# Patient Record
Sex: Male | Born: 1980 | Race: White | Hispanic: No | Marital: Single | Smoking: Current every day smoker
Health system: Southern US, Community
[De-identification: ages and names within clinical notes are randomized; demographics above are authoritative.]

## PROBLEM LIST (undated history)

## (undated) ENCOUNTER — Emergency Department (HOSPITAL_COMMUNITY): Admission: EM | Payer: Commercial Managed Care - HMO | Source: Home / Self Care

## (undated) DIAGNOSIS — E669 Obesity, unspecified: Secondary | ICD-10-CM

## (undated) DIAGNOSIS — F4310 Post-traumatic stress disorder, unspecified: Secondary | ICD-10-CM

## (undated) DIAGNOSIS — G709 Myoneural disorder, unspecified: Secondary | ICD-10-CM

## (undated) DIAGNOSIS — G4730 Sleep apnea, unspecified: Secondary | ICD-10-CM

## (undated) DIAGNOSIS — K219 Gastro-esophageal reflux disease without esophagitis: Secondary | ICD-10-CM

## (undated) DIAGNOSIS — E785 Hyperlipidemia, unspecified: Secondary | ICD-10-CM

## (undated) HISTORY — PX: FRACTURE SURGERY: SHX138

## (undated) HISTORY — DX: Hyperlipidemia, unspecified: E78.5

## (undated) HISTORY — DX: Obesity, unspecified: E66.9

## (undated) HISTORY — DX: Sleep apnea, unspecified: G47.30

---

## 2008-03-16 ENCOUNTER — Emergency Department (HOSPITAL_COMMUNITY): Admission: EM | Admit: 2008-03-16 | Discharge: 2008-03-17 | Payer: Self-pay | Admitting: Emergency Medicine

## 2010-01-14 HISTORY — PX: FRACTURE SURGERY: SHX138

## 2012-02-24 ENCOUNTER — Ambulatory Visit (INDEPENDENT_AMBULATORY_CARE_PROVIDER_SITE_OTHER): Payer: 59 | Admitting: Physician Assistant

## 2012-02-24 VITALS — BP 124/85 | HR 88 | Temp 98.5°F | Resp 18 | Ht 71.0 in | Wt 274.0 lb

## 2012-02-24 DIAGNOSIS — J988 Other specified respiratory disorders: Secondary | ICD-10-CM

## 2012-02-24 DIAGNOSIS — R0982 Postnasal drip: Secondary | ICD-10-CM

## 2012-02-24 DIAGNOSIS — B9789 Other viral agents as the cause of diseases classified elsewhere: Secondary | ICD-10-CM

## 2012-02-24 MED ORDER — TRIAMCINOLONE ACETONIDE(NASAL) 55 MCG/ACT NA INHA: 2.0000 | Freq: Every day | NASAL | Status: DC

## 2012-02-24 MED ORDER — GUAIFENESIN ER 1200 MG PO TB12: 1.0000 | ORAL_TABLET | Freq: Two times a day (BID) | ORAL | Status: DC

## 2012-02-24 NOTE — Progress Notes (Signed)
   [address], [place] [address]   Phone [phone]  Subjective:    Patient ID: Edwin Craig, male    DOB: 12/23/1980, 32 y.o.   MRN: [id number]  HPI Pt presents to clinic with 1 wk h/o cold symptoms.  Now with an irritated sore throat and stuff draining down the back of his throat with ear fullness and popping.  He has used a few doses of Nyquil to help him sleep at night because of the PND.  He has had no F,C.  No headaches.  He just does not feel like he is getting better. No exposures to strep.   Review of Systems  Constitutional: Negative for fever and chills.  HENT: Positive for congestion, sore throat and postnasal drip. Negative for rhinorrhea.   Respiratory: Negative for cough.   Gastrointestinal: Negative for nausea, vomiting and diarrhea.  Allergic/Immunologic: Negative for environmental allergies.  Neurological: Negative for headaches.  Psychiatric/Behavioral: Positive for sleep disturbance (2nd to PND).       Objective:   Physical Exam  Vitals reviewed. Constitutional: He is oriented to person, place, and time. He appears well-developed and well-nourished.  HENT:  Head: Normocephalic and atraumatic.  Right Ear: Hearing, tympanic membrane, external ear and ear canal normal.  Left Ear: Hearing, tympanic membrane, external ear and ear canal normal.  Nose: Mucosal edema (red) present.  Mouth/Throat: Uvula is midline, oropharynx is clear and moist and mucous membranes are normal.  Eyes: Conjunctivae are normal.  Neck: Normal range of motion. Neck supple.  Cardiovascular: Normal rate, regular rhythm and normal heart sounds.  Exam reveals no gallop.   No murmur heard. Pulmonary/Chest: Effort normal and breath sounds normal.  Lymphadenopathy:    He has no cervical adenopathy.  Neurological: He is alert and oriented to person, place, and time.  Skin: Skin is warm and dry.  Psychiatric: He has a normal mood and affect. His behavior is normal. Judgment and thought  content normal.          Assessment & Plan:   1. PND (post-nasal drip)   2. Viral respiratory illness    Push fluids.  Tylenol/motrin prn.  Meds as directed.  If no improvement in 1 wk call for abx to treat sinus infection.

## 2012-08-31 ENCOUNTER — Other Ambulatory Visit: Payer: Self-pay | Admitting: Occupational Medicine

## 2012-08-31 ENCOUNTER — Ambulatory Visit
Admission: RE | Admit: 2012-08-31 | Discharge: 2012-08-31 | Disposition: A | Discharge status: Home / Self Care | Payer: No Typology Code available for payment source | Source: Ambulatory Visit | Attending: Occupational Medicine | Admitting: Occupational Medicine

## 2012-08-31 DIAGNOSIS — Z0000 Encounter for general adult medical examination without abnormal findings: Secondary | ICD-10-CM

## 2016-02-05 ENCOUNTER — Emergency Department (HOSPITAL_COMMUNITY)
Admission: EM | Admit: 2016-02-05 | Discharge: 2016-02-05 | Disposition: A | Discharge status: Home / Self Care | Payer: Commercial Managed Care - HMO | Attending: Emergency Medicine | Admitting: Emergency Medicine

## 2016-02-05 ENCOUNTER — Encounter (HOSPITAL_COMMUNITY): Payer: Self-pay | Admitting: Emergency Medicine

## 2016-02-05 DIAGNOSIS — S01412A Laceration without foreign body of left cheek and temporomandibular area, initial encounter: Secondary | ICD-10-CM | POA: Insufficient documentation

## 2016-02-05 DIAGNOSIS — S01112A Laceration without foreign body of left eyelid and periocular area, initial encounter: Secondary | ICD-10-CM | POA: Insufficient documentation

## 2016-02-05 DIAGNOSIS — S61452A Open bite of left hand, initial encounter: Secondary | ICD-10-CM | POA: Insufficient documentation

## 2016-02-05 DIAGNOSIS — Z79899 Other long term (current) drug therapy: Secondary | ICD-10-CM | POA: Diagnosis not present

## 2016-02-05 DIAGNOSIS — S0181XA Laceration without foreign body of other part of head, initial encounter: Secondary | ICD-10-CM

## 2016-02-05 DIAGNOSIS — F17200 Nicotine dependence, unspecified, uncomplicated: Secondary | ICD-10-CM | POA: Insufficient documentation

## 2016-02-05 DIAGNOSIS — W540XXA Bitten by dog, initial encounter: Secondary | ICD-10-CM | POA: Insufficient documentation

## 2016-02-05 DIAGNOSIS — S0185XA Open bite of other part of head, initial encounter: Secondary | ICD-10-CM

## 2016-02-05 DIAGNOSIS — Y939 Activity, unspecified: Secondary | ICD-10-CM | POA: Insufficient documentation

## 2016-02-05 DIAGNOSIS — Y999 Unspecified external cause status: Secondary | ICD-10-CM | POA: Diagnosis not present

## 2016-02-05 DIAGNOSIS — Y929 Unspecified place or not applicable: Secondary | ICD-10-CM | POA: Insufficient documentation

## 2016-02-05 MED ORDER — AMOXICILLIN-POT CLAVULANATE 875-125 MG PO TABS
1.0000 | ORAL_TABLET | Freq: Once | ORAL | Status: AC
  Administered 2016-02-05: 1 via ORAL
  Filled 2016-02-05: qty 1

## 2016-02-05 MED ORDER — AMOXICILLIN-POT CLAVULANATE 875-125 MG PO TABS
1.0000 | ORAL_TABLET | Freq: Two times a day (BID) | ORAL | 0 refills | Status: DC
Start: 2016-02-05 — End: 2016-04-02

## 2016-02-05 NOTE — ED Triage Notes (Signed)
Pt here for dog bite to left hand and above left eye; scratches noted and bleeding controlled; pt sts dog is UTD on rabies

## 2016-02-05 NOTE — ED Provider Notes (Signed)
[facility]-EMERGENCY DEPT Provider Note   CSN: [id number] Arrival date & time: 02/05/16  4098   By signing my name below, I, Freida Busman, attest that this documentation has been prepared under the direction and in the presence of Rolland Porter, MD . Electronically Signed: Freida Busman, Scribe. 02/05/2016. 12:30 PM.  History   Chief Complaint Chief Complaint  Patient presents with  . Animal Bite    The history is provided by the patient. No language interpreter was used.    HPI Comments:  Edwin Craig is a 36 y.o. male who presents to the Emergency Department complaining of animal bites to the left hand, left eyelid, and left face which he sustained this AM. Pt was trying to bring his rotweiler back into the house when it became aggravated and bit the pt. The pt believes the dog's immunizations are UTD. His own Tetanus is UTD within the last 5 years. Pt has no other acute complaints or associated symptoms at this time.     History reviewed. No pertinent past medical history.  There are no active problems to display for this patient.   Past Surgical History:  Procedure Laterality Date  . FRACTURE SURGERY         Home Medications    Prior to Admission medications   Medication Sig Start Date End Date Taking? Authorizing Provider  amoxicillin-clavulanate (AUGMENTIN) 875-125 MG tablet Take 1 tablet by mouth 2 (two) times daily. 02/05/16   Rolland Porter, MD  Guaifenesin [facility] MAXIMUM STRENGTH) 1200 MG TB12 Take 1 tablet (1,200 mg total) by mouth 2 (two) times daily. 02/24/12   Morrell Riddle, PA-C  triamcinolone (NASACORT AQ) 55 MCG/ACT nasal inhaler Place 2 sprays into the nose daily. 02/24/12   Morrell Riddle, PA-C    Family History History reviewed. No pertinent family history.  Social History Social History  Substance Use Topics  . Smoking status: Light Tobacco Smoker    Packs/day: 0.12  . Smokeless tobacco: Not on file  . Alcohol use Yes     Allergies   Patient has  no known allergies.   Review of Systems Review of Systems  Constitutional: Negative for appetite change, chills, diaphoresis, fatigue and fever.  HENT: Negative for mouth sores, sore throat and trouble swallowing.   Eyes: Negative for visual disturbance.  Respiratory: Negative for cough, chest tightness, shortness of breath and wheezing.   Cardiovascular: Negative for chest pain.  Gastrointestinal: Negative for abdominal distention, abdominal pain, diarrhea, nausea and vomiting.  Endocrine: Negative for polydipsia, polyphagia and polyuria.  Genitourinary: Negative for dysuria, frequency and hematuria.  Musculoskeletal: Negative for gait problem.  Skin: Positive for wound. Negative for pallor and rash.  Neurological: Negative for dizziness, syncope, light-headedness and headaches.  Hematological: Does not bruise/bleed easily.  Psychiatric/Behavioral: Negative for behavioral problems and confusion.     Physical Exam Updated Vital Signs BP 139/87 (BP Location: Right Arm)   Pulse 89   Temp 99 F (37.2 C) (Oral)   Resp 18   Ht 6' (1.829 m)   Wt 270 lb (122.5 kg)   SpO2 96%   BMI 36.62 kg/m   Physical Exam  Constitutional: He is oriented to person, place, and time. He appears well-developed and well-nourished. No distress.  HENT:  Head: Normocephalic.  globe intact; sensation  to face intact  Eyes: Conjunctivae are normal. Pupils are equal, round, and reactive to light. No scleral icterus.  Neck: Normal range of motion. Neck supple. No thyromegaly present.  Cardiovascular: Normal rate  and regular rhythm.  Exam reveals no gallop and no friction rub.   No murmur heard. Pulmonary/Chest: Effort normal and breath sounds normal. No respiratory distress. He has no wheezes. He has no rales.  Abdominal: Soft. Bowel sounds are normal. He exhibits no distension. There is no tenderness. There is no rebound.  Musculoskeletal: Normal range of motion.  Neurological: He is alert and oriented  to person, place, and time.  Skin: Skin is warm and dry. No rash noted.  Multiple abrasions to the left hand, specifically the left thumb, and first digit  There is a pucture wound to the web space of the left hand 2 cm laceration to the superior aspect of left eyelid, right below the brow 1.5 cm to the left cheek just anterior to the left ear   Psychiatric: He has a normal mood and affect. His behavior is normal.     ED Treatments / Results  DIAGNOSTIC STUDIES:  Oxygen Saturation is 96% on RA, normal by my interpretation.    COORDINATION OF CARE:  12:21 PM Discussed treatment plan with pt at bedside and pt agreed to plan.  Labs (all labs ordered are listed, but only abnormal results are displayed) Labs Reviewed - No data to display  EKG  EKG Interpretation None       Radiology No results found.  Procedures Procedures (including critical care time)  LACERATION REPAIR PROCEDURE NOTE The patient's identification was confirmed and consent was obtained. This procedure was performed by Rolland Porter, MD at 12:22 PM. Site: left eyelid Sterile procedures observed Anesthetic used (type and amt): 1% lido with epi Suture type/size: 5-0 nylon Length: 2 cm # of Sutures: 6   Technique:simple  Antibx ointment applied Tetanus UTD Site anesthetized, irrigated with NS, explored without evidence of foreign body, wound well approximated, site covered with dry, sterile dressing.  Patient tolerated procedure well without complications. Instructions for care discussed verbally and patient provided with additional written instructions for homecare and f/u.  LACERATION REPAIR PROCEDURE NOTE The patient's identification was confirmed and consent was obtained. This procedure was performed by Rolland Porter, MD at 12:22 PM. Site: left cheek  Sterile procedures observed Anesthetic used (type and amt): 1% lido with epi Suture type/size: 5-0 nylon Length:1.5 cm  # of Sutures: 3 Technique:  simple Antibx ointment applied Tetanus UTD Site anesthetized, irrigated with NS, explored without evidence of foreign body, wound well approximated, site covered with dry, sterile dressing.  Patient tolerated procedure well without complications. Instructions for care discussed verbally and patient provided with additional written instructions for homecare and f/u.   Medications Ordered in ED Medications  amoxicillin-clavulanate (AUGMENTIN) 875-125 MG per tablet 1 tablet (1 tablet Oral Given 02/05/16 1304)     Initial Impression / Assessment and Plan / ED Course  I have reviewed the triage vital signs and the nursing notes.  Pertinent labs & imaging results that were available during my care of the patient were reviewed by me and considered in my medical decision making (see chart for details).   plan antibiotic prophylaxis. Suturable in 5-7 days.  Final Clinical Impressions(s) / ED Diagnoses   Final diagnoses:  Dog bite of face, initial encounter  Facial laceration, initial encounter    New Prescriptions Discharge Medication List as of 02/05/2016 12:57 PM    START taking these medications   Details  amoxicillin-clavulanate (AUGMENTIN) 875-125 MG tablet Take 1 tablet by mouth 2 (two) times daily., Starting Mon 02/05/2016, Print      I  personally performed the services described in this documentation, which was scribed in my presence. The recorded information has been reviewed and is accurate.     Rolland PorterMark Joyceann Kruser, MD 02/15/16 1435

## 2016-02-05 NOTE — Discharge Instructions (Signed)
Check in ER over next 2 days. (If through ER on duty just ask physician for wound check)  Suture removal 5-7 days with primary care, urgent care, or return to ER.

## 2016-04-02 ENCOUNTER — Ambulatory Visit (INDEPENDENT_AMBULATORY_CARE_PROVIDER_SITE_OTHER): Payer: Commercial Managed Care - HMO | Admitting: Neurology

## 2016-04-02 ENCOUNTER — Encounter: Payer: Self-pay | Admitting: Neurology

## 2016-04-02 DIAGNOSIS — R5383 Other fatigue: Secondary | ICD-10-CM | POA: Diagnosis not present

## 2016-04-02 DIAGNOSIS — R0683 Snoring: Secondary | ICD-10-CM

## 2016-04-02 DIAGNOSIS — G4733 Obstructive sleep apnea (adult) (pediatric): Secondary | ICD-10-CM | POA: Diagnosis not present

## 2016-04-02 DIAGNOSIS — Z6838 Body mass index (BMI) 38.0-38.9, adult: Secondary | ICD-10-CM | POA: Diagnosis not present

## 2016-04-02 NOTE — Progress Notes (Signed)
[facility]  PATIENT: Edwin Craig DOB: 1980/04/17  REFERRING DOCTOR OR PCP:  Dr. Gregary Signs SOURCE: patient, notes from Dr. Tresa Endo  _________________________________   HISTORICAL  CHIEF COMPLAINT:  Chief Complaint  Patient presents with  . Snoring    Edwin Craig is here today for eval of snoring, witnessed apneas by wife, daytime fatigue. Wakes during the night gasping. Remembers having a sleep study at age 36--in [place], but no study since./fim  . Daytime Fatigue    HISTORY OF PRESENT ILLNESS:  I had the pleasure seeing you patient, Edwin Craig, [facility] for neurologic consultation regarding his suspected sleep apnea.  He is a 36 year old man who has snoring and witnessed sleep apnea by his wife. His snoring is so loud, his wife will wear ear plugs and often sleeps in a different room  During the night, he will sometimes wake up gasping for air. He notes fatigue and lethargy during the day at times.   He has some sleepiness during the evenings.   Of note, even as a teenager, he snored father had noted that he sometimes has pauses in his breathing at night. Had gasping for air at night for at least the last 5 years. Weight has increased 40 pounds since last year, but he does not think there has been any change in his leap or snoring..  On a typical night, he goes to bed around 11 PM. He falls asleep within minutes. He usually sleeps well for a few hours but then will often wake up several times during the second half of the night. He will toss and turn something to get comfortable but usually is able to fall back asleep. He does not have nocturia.    He is usually up in the morning around 8 AM. He does not feel refreshed most mornings.  EPWORTH SLEEPINESS SCALE  On a scale of 0 - 3 what is the chance of dozing:  Sitting and Reading:   2 Watching TV:    3 Sitting inactive in a public place: 0 Passenger in car for one  hour: 0 Lying down to rest in the afternoon: 3 Sitting and talking to someone: 0 Sitting quietly after lunch:  0 In a car, stopped in traffic:  0  Total (out of 24):   8/24  He is healthy and does not have HTN or DM.    He has GERD.      REVIEW OF SYSTEMS: Constitutional: No fevers, chills, sweats, or change in appetite. Has fatigue Eyes: No visual changes, double vision, eye pain Ear, nose and throat: No hearing loss, ear pain, nasal congestion, sore throat Cardiovascular: No chest pain, palpitations Respiratory: No shortness of breath at rest or with exertion.   No wheezes.   Snoring GastrointestinaI: he has GERD.  No nausea, vomiting, diarrhea, abdominal pain, fecal incontinence Genitourinary: No dysuria, urinary retention or frequency.  No nocturia. Musculoskeletal: No neck pain, back pain Integumentary: No rash, pruritus, skin lesions Neurological: as above Psychiatric: No depression at this time.  No anxiety Endocrine: No palpitations, diaphoresis, change in appetite, change in weigh or increased thirst Hematologic/Lymphatic: No anemia, purpura, petechiae. Allergic/Immunologic: No itchy/runny eyes, nasal congestion, recent allergic reactions, rashes  ALLERGIES: No Known Allergies  HOME MEDICATIONS:  Current Outpatient Prescriptions:  .  pantoprazole (PROTONIX) 40 MG tablet, , Disp: , Rfl:   PAST MEDICAL HISTORY: No past medical history on file.  PAST SURGICAL HISTORY: Past Surgical History:  Procedure Laterality Date  .  FRACTURE SURGERY      FAMILY HISTORY: Family History  Problem Relation Age of Onset  . Healthy Mother   . Healthy Father   . Healthy Sister   . Healthy Brother   . Healthy Sister     SOCIAL HISTORY:  Social History   Social History  . Marital status: Single    Spouse name: N/A  . Number of children: N/A  . Years of education: N/A   Occupational History  . Not on file.   Social History Main Topics  . Smoking status: Current  Every Day Smoker    Packs/day: 0.12    Types: Cigarettes  . Smokeless tobacco: Current User  . Alcohol use Yes     Comment: 3-4 beers on the weekend  . Drug use: No  . Sexual activity: Yes   Other Topics Concern  . Not on file   Social History Narrative  . No narrative on file     PHYSICAL EXAM  Vitals:   04/02/16 1011  BP: 104/60  Pulse: 72  Resp: 14  Weight: 281 lb (127.5 kg)  Height: 6' (1.829 m)    Body mass index is 38.11 kg/m.   General: The patient is well-developed and well-nourished and in no acute distress.   Head is [place]/AT.   Pharynx is nonerythematous and Mallampati 3.     Eyes:  Funduscopic exam shows normal optic discs and retinal vessels.  Neck: The neck is supple, no carotid bruits are noted.  The neck is nontender.  Cardiovascular: The heart has a regular rate and rhythm with a normal S1 and S2. There were no murmurs, gallops or rubs. Lungs are clear to auscultation.  Skin: Extremities are without rash or edema.   Neurologic Exam  Mental status: The patient is alert and oriented x 3 at the time of the examination. The patient has apparent normal recent and remote memory, with an apparently normal attention span and concentration ability.   Speech is normal.  Cranial nerves: Extraocular movements are full. Pupils are equal, round, and reactive to light and accomodation.  Visual fields are full.  Facial symmetry is present. There is good facial sensation to soft touch bilaterally.Facial strength is normal.  Trapezius and sternocleidomastoid strength is normal. No dysarthria is noted.  The tongue is midline, and the patient has symmetric elevation of the soft palate. No obvious hearing deficits are noted.  Motor:  Muscle bulk is normal.   Tone is normal. Strength is  5 / 5 in all 4 extremities.   Sensory: Sensory testing is intact to pinprick, soft touch and vibration sensation in all 4 extremities.  Coordination: Cerebellar testing reveals good  finger-nose-finger and heel-to-shin bilaterally.  Gait and station: Station is normal.   Gait is normal. Tandem gait is normal. Romberg is negative.   Reflexes: Deep tendon reflexes are symmetric and normal bilaterally.   Plantar responses are flexor.    DIAGNOSTIC DATA (LABS, IMAGING, TESTING) - I reviewed patient records, labs, notes, testing and imaging myself where available.     ASSESSMENT AND PLAN  OSA (obstructive sleep apnea) - Plan: Split night study  Other fatigue  Snoring - Plan: Split night study  BMI 38.0-38.9,adult - Plan: Split night study   In summary, Mr. Thomasenia BottomsSletten is a 36 year old man who has snoring, daytime fatigue and witnessed say symptoms at night. Additionally, sometimes he wakes himself up gasping for air. He has obesity with a BMI is 38. He most likely has obstructive sleep  apnea and would likely benefit from treatment. We discussed that successful treatment of sleep apnea often leads to improved daytime wakefulness and can lower the risk of heart attack and stroke. A split-night study and we will initiate CPAP if he has moderate or severe sleep apnea. If mild OSA, he could be a candidate for other types of treatments as well. He is also advised to lose weight.  He will return to see me in 3 months or sooner if there are new or worsening neurologic symptoms.  She presently see Mr. Loescher for neurologic consultation. Please let me know if I can be of further assistance with her other patients in the future.   Josua Ferrebee A. Epimenio Foot, MD, PhD 04/02/2016, 10:25 AM Certified in Neurology, Clinical Neurophysiology, Sleep Medicine, Pain Medicine and Neuroimaging  [facility] [address] [place], [place] [address] [phone]

## 2016-05-14 ENCOUNTER — Ambulatory Visit (INDEPENDENT_AMBULATORY_CARE_PROVIDER_SITE_OTHER): Payer: Commercial Managed Care - HMO | Admitting: Neurology

## 2016-05-14 DIAGNOSIS — Z6838 Body mass index (BMI) 38.0-38.9, adult: Secondary | ICD-10-CM

## 2016-05-14 DIAGNOSIS — R0683 Snoring: Secondary | ICD-10-CM

## 2016-05-14 DIAGNOSIS — G4733 Obstructive sleep apnea (adult) (pediatric): Secondary | ICD-10-CM | POA: Diagnosis not present

## 2016-05-16 NOTE — Progress Notes (Signed)
PATIENT'S NAME:  Edwin Craig, Edwin Craig DOB:      06-23-1980      MR#:    [id number]     DATE OF RECORDING: 05/14/2016 REFERRING M.D.:  Gregary Signs, MD Study Performed:  Split-Night Titration Study HISTORY:  He is a 36 year old man who has snoring and witnessed sleep apnea by his wife. His snoring is so loud, his wife will wear ear plugs and often sleeps in a different room during the night, and he will sometimes wake up gasping for air. He notes fatigue and lethargy during the day at times.   He has some sleepiness during the evenings.   Of note, even as a teenager, he snored father had noted that he sometimes has pauses in his breathing at night.. Weight has increased 40 pounds since last year, but he does not think there has been any change in his leap or snoring.  OSA, fatigue and snoring  The patient endorsed the Epworth Sleepiness Scale at 8/24 points    The patient's weight 281 pounds with a height of 72 (inches), resulting in a BMI of 37.9 kg/m2.  CURRENT MEDICATIONS: Pantoprazole    PROCEDURE:  This is a multichannel digital polysomnogram utilizing the Somnostar 11.2 system.  Electrodes and sensors were applied and monitored per AASM Specifications.   EEG, EOG, Chin and Limb EMG, were sampled at 200 Hz.  ECG, Snore and Nasal Pressure, Thermal Airflow, Respiratory Effort, CPAP Flow and Pressure, Oximetry was sampled at 50 Hz. Digital video and audio were recorded.      BASELINE STUDY WITHOUT CPAP RESULTS:  Lights Out was at 22:06 and Lights On at 05:17.  Total recording time (TRT) was 211.5, with a total sleep time (TST) of 141.5 minutes.   The patient's sleep latency was 23 minutes.  REM latency was 0 minutes.  The sleep efficiency was 66.9 %.    SLEEP ARCHITECTURE: WASO (Wake after sleep onset) was 56.5 minutes, Stage N1 was 15.5 minutes, Stage N2 was 126 minutes, Stage N3 was 0 minutes and Stage R (REM sleep) was 0 minutes.  The percentages were Stage N1 11.%, Stage N2 89.%, Stage N3  0% and Stage R (REM sleep) 0%.   The sleep architecture was notable for lack of REM and N3 stage sleep  The arousals were noted as: 26 were spontaneous, 0 were associated with PLMs, 117 were associated with respiratory events.    Audio and video analysis did not show any abnormal or unusual movements, behaviors, phonations or vocalizations  EKG was in keeping with normal sinus rhythm (NSR)  RESPIRATORY ANALYSIS:  There were a total of 117 respiratory events:  61 obstructive apneas, 0 central apneas and 0 mixed apneas with a total of 61 apneas and an apnea index (AI) of 25.9. There were 56 hypopneas with a hypopnea index of 23.7. The patient also had 0 respiratory event related arousals (RERAs).  Snoring was noted.     The total APNEA/HYPOPNEA INDEX (AHI) was 49.6 /hour and the total RESPIRATORY DISTURBANCE INDEX was 49.6 /hour.  0 events occurred in REM sleep and 112 events in NREM. The REM AHI was 0, /hour versus a non-REM AHI of 49.6 /hour. The patient spent 5 minutes sleep time in the supine position 317 minutes in non-supine. The supine AHI was 60.0 /hour versus a non-supine AHI of 49.2 /hour.  OXYGEN SATURATION & C02:  The wake baseline 02 saturation was 95%, with the lowest being 85%. Time spent below 89% saturation equaled  84 minutes.  PERIODIC LIMB MOVEMENTS:    The patient had a total of 0 Periodic Limb Movements.  The Periodic Limb Movement (PLM) index was 0 /hour and the PLM Arousal index was 0 /hour.   TITRATION STUDY WITH CPAP RESULTS:   CPAP was initiated at 0/0/0/0 cmH20 with heated humidity per AASM split night standards and pressure was advanced to 9/9 cmH20 because of hypopneas, apneas and desaturations.  At a PAP pressure of 9 cmH20, there was a reduction of the AHI to 0 /hour.   Total recording time (TRT) was 220 minutes, with a total sleep time (TST) of 180 minutes. The patient's sleep latency was 35 minutes. REM latency was 70.5 minutes.  The sleep efficiency was 81.8 %.     SLEEP ARCHITECTURE: Wake after sleep was 15 minutes, Stage N1 9.5 minutes, Stage N2 108 minutes, Stage N3 0 minutes and Stage R (REM sleep) 62.5 minutes. The percentages were: Stage N1 5.3%, Stage N2 60.%, Stage N3 0% and Stage R (REM sleep) 34.7%. The sleep architecture was notable for reduced stage N3 sleep and increased stage REM sleep. .  The arousals were noted as: 13 were spontaneous, 0 were associated with PLMs, 2 were associated with respiratory events.  RESPIRATORY ANALYSIS:  There were a total of 2 respiratory events: 0 obstructive apneas, 0 central apneas and 0 mixed apneas with a total of 0 apneas and an apnea index (AI) of 0. There were 2 hypopneas with a hypopnea index of .7 /hour. The patient also had 0 respiratory event related arousals (RERAs).      The total APNEA/HYPOPNEA INDEX  (AHI) was .7 /hour and the total RESPIRATORY DISTURBANCE INDEX was .7 /hour.  0 events occurred in REM sleep and 2 events in NREM. The REM AHI was 0 /hour versus a non-REM AHI of 1. /hour. REM sleep was achieved on a pressure of  cm/h2o (AHI was  .) The patient spent 0% of total sleep time in the supine position. The supine AHI was 0.0 /hour, versus a non-supine AHI of 0.7/hour.  OXYGEN SATURATION & C02:  The wake baseline 02 saturation was 90%, with the lowest being 90%. Time spent below 89% saturation equaled 0 minutes.  PERIODIC LIMB MOVEMENTS:    The patient had a total of 0 Periodic Limb Movements. The Periodic Limb Movement (PLM) index was 0 /hour and the PLM Arousal index was 0 /hour.    POLYSOMNOGRAPHY IMPRESSION :   1. Severe Obstructive Sleep Apnea(OSA) with an AHI = 49.6 2. Successful CPAP titration with optimal pressure of 9 cm H2O    RECOMMENDATIONS:  1. Advise to start CPAP at 9 cmH2O and follow clinical symptomatology.   2. A F & P Simplus mask was used with heated humidity during this study.  Advise to add heated humidity.  Adjust interface and heated humidity as needed.      3. Compliance to PAP therapy should be emphasized.  Compliance, AHI and air leak information to be downloaded for objective assessment at 30 days, 180 days and annually thereafter.   4. Avoid sedative-hypnotics which may worsen sleep apnea, alcohol and tobacco (as applicable). 5. A follow up [facility].      I certify that I have reviewed the entire raw data recording prior to the issuance of this report in accordance with the Standards of Accreditation of the American Academy of Sleep Medicine (AASM)    Imojean Yoshino,MD,PhD  Diplomat, Biomedical engineer of Psychiatry and Neurology  Diplomat, Biomedical engineer of Sleep Medicine     Demographics and Medical History           Name: Erby, Sanderson Age: 1 BMI: 37.9 Interp Physician: Despina Arias, MD  DOB: [birth year] Ht-IN: 72 CM: 183 Referred By: Gregary Signs, MD  Pt. Tag:  Wt-LB: 281 KG: 127 Tested By: Idelle Leech, RPSGT  Pt. #: [id number] Sex: Male Scored By: Charlett Blake, RPSGT  Bed Tag: [id number] Race: Caucasian  Sleep Summary    Sleep Time Statistics Minutes Hours    Time in Bed 431.5    0.0    Total Sleep Time  321.5    0.0    Total Sleep Time NREM 259    0.0    Total Sleep Time REM 62.5    0.0    Sleep Onset 5.5 !C8 Is Not In Table    [facility] After Sleep Onset 104.5 !C9 Is Not In Table    Wake After Sleep Period 0    0.0    Latency Persistent Sleep 23    7.2    Sleep Efficiency 74.5 Percent    Lights out 22:06     Lights on 05:17    Sleep Disruption Events Count Index    Arousals 159 29.7    Awakenings 0 0    Arousals + Awakenings 159 29.7    REM Awakenings 0 0.0     Sleep Stage Statistics Wake N1 N2 N3 REM    Percent Stage to SPT 24.5  5.9  54.9  0.0  14.7  Percent   Sleep Period Time in Stage 104.5 25 234 0 62.5 Minutes   Latency to Stage  5.5 6.5 0 301.5 Minutes   Percent Stage to TST  7.8 72.8 0 19.4 Percent   EKG Summary          EKG  Statistics         Heart Rate, Wake 86.5 BPM  TST Epochs in HR Interval 0 < 29   Heart Rate, Steady Sleep Avg 78 BPM   0 30-59   PAC Events 0 Count   407 60-79   PVC Events 0 Count   235 80-99   Bradycardia 0 Count   0 100-119   Tachycardia 0 Count   1 120-139        0 140-159    NREM REM   0 > 160   Shortest R-R .5 .7       Longest R-R .9 .8           Respiration Summary    Event Statistics Total  With Arousal  With Awakening    Count Index  Count Index  Count Index   Apneas, Total 61 11.4  61 11.4   0 0.0    Hypopneas, Total 58 10.8  58 10.8   0 0.0    Apnea + Hypopnea Index 119 22.2   119 22.2   0 0.0   Oximetry Statistics        SpO2, Mean Wake 95 Percent  SpO2, Range -11 Percent   SpO2, Minimum 85 Percent  SpO2 Mean 91 Percent           SpO2 Intervals > 89% 80-89% 70-79% 60-69% 50-59% 40-49% 30-39% < 30%  321.5 Percent Sleep Time 62.1 37.9 0 0 0 0 0 0  Body Position Statistics   Back Side L Side R Side Prone    Total Sleep  Time   5 316.5 76 240.5 0 Minutes   Percent Time to TST   1.6  98.4  23.6  74.8  0.0  Percent   Number of Events   5 114.0 6 108 0 Count   Number of Apneas   0 61 0 61 0 Count   Number of Hypopneas   5 53 6 47 0 Count   Apnea Index   0.0  11.6  0.0  15.2  0.0  Index   Hypopnea Index   60.0  10.0  4.7  11.7  0.0  Index   Apnea + Hypopnea Index   60.0  21.6  4.7  26.9  0.0  Index    Respiration Events - Pre Treatment    Non REM, Pre Rx Statistics Non Supine  Supine    Central Mixed Obstr  Central Mixed Obstr   Apneas 0 0 61  0 0 0 Count  Apneas, Minimum SpO2 0 0 86  0 0 0 Percent     Hypopneas 0 0 51  0 0 5 Count  Hypopneas, Minimum SpO2 0 0 85  0 0 86 Percent     Apnea + Hypopneas Index 0.0 0.0 49.2  0.0 0.0 60.0 Index    REM, Pre Rx Statistics Non Supine  Supine    Central Mixed Obstr  Central Mixed Obstr   Apneas 0 0 0  0 0 0 Count  Apneas, Minimum SpO2 0 0 0  0 0 0 Percent     Hypopneas 0 0 0  0 0 0 Count    Hypopneas, Minimum SpO2 0 0 0  0 0 0 Percent     Apnea + Hypopnea Index 0.0 0.0 0.0  0.0 0.0 0.0 Index             Respiration Events - Post Treatment    Non REM, Post Rx Statistics Non Supine  Supine    Central Mixed Obstr  Central Mixed Obstr   Apneas 0 0 0  0 0 0 Count  Apneas, Minimum SpO2 0 0 0  0 0 0 Percent     Hypopneas 0 0 2  0 0 0 Count  Hypopneas, Minimum SpO2 0 0 93  0 0 0 Percent     Apnea + Hypoapnea Index 0.0 0.0 1.0  0.0 0.0 0.0 Index    REM, Post Rx Statistics Non Supine  Supine    Central Mixed Obstr  Central Mixed Obstr   Apneas 0 0 0  0 0 0 Count  Apneas, Minimum SpO2 0 0 0  0 0 0 Percent     Hypopneas 0 0 0  0 0 0 Count  Hypopneas, Minimum SpO2 0 0 0  0 0 0 Percent     Apnea + Hypopnea Index 0.0 0.0 0.0  0.0 0.0 0.0 Index   Leg Movement Summary    PLMS Non REM (Incl. Wake) REM Total    No Arousal Arousal Wake No Arousal Arousal Wake No Arousal Arousal Wake Total   Isolated 3 1 0 0 0 0 3 1 0 4    PLMS 0 0 0 0 0 0 0 0 0 0    Total 3 1 0 0 0 0 3 1 0 4   PLMS Statistics PLMS Total     Count Index Count Index    PLM 0 0 4 0.7     PLM with Arousal 0 0 1 0.2     PLM, with Wake 0 0  0 0.0     PLM, Arousal + Wake 0 0.0 1 0.2     PLM, No Arousal 0 0.0  3 0.6     PLM, Non REM 0 0.0  4 0.9     PLM, REM 0 0.0  0 0.0     Technician Comments:  Patient came in for a routine nocturnal Polysomnogram with possible CPAP titration. Patient was scored using the 4% rule. Patient was seen sleeping supine for a short period of time and both lateral positions. Patient did show signs of severe OSA despite sleeping position. Patient did say that he never sleeps supine. Patient was fitted to the Resmed AirFit P10 (Med) at first but shortly realized that he wouldn't be able to sleep with his mouth closed so a F&P Simplus was applied which he did well with and did seem to be titrated. Patient had zero restroom breaks during the study.      Sleep Summary                  Pre Tx Post Tx Total    Pre Tx Post Tx Total    Total Sleep Time 141.5 180 321.5 Minutes  Sleep Efficiency 66.9 81.8 74.5 Percent   TST Supine 5 0 5 Minutes  Sleep Onset 5.5 25 5.5 Minutes   TST Side/Prone 136.5 180.0 316.5 Minutes  REM Latency 0 70.5 301.5 Minutes  16.5 Time in Bed 211.5 220 431.5 Minutes                     0  Minutes    % TST   120  Pre Tx Post Tx Total    Pre Tx Post Tx Total   0 Stage N1 15.5 9.5 25 Minutes  Stage N1 11. 5.3 7.8 Percent   Stage N2 126 108 234 Minutes  Stage N2 89. 60. 72.8 Percent   Stage N3 0 0 0 Minutes  Stage N3 0 0 0 Percent   REM 0 62.5 62.5 Minutes  REM 0 34.7 19.4 Percent      Pre Tx Post Tx Total        Count Index Count  Index Count Index       Arousals 144 61.1 15 5. 159 29.7       Awakenings 0 0 0 0 0 0       ArArousals + Awakenings 144 61.1 15 5. 159 29.7              Lights out-22:06  Lights on- 05:17 Respiratory Summary                 Event Statistics Supine  Non-Supine  Total    Pre Tx Post Tx Total  Pre Tx Post Tx Total  Pre Tx Post Tx Total   Total A + H 5 0 5  112 2 114  117 2 119   AHI 60.0 0.0 60.0  49.2 0.7 21.6  49.6 .7 22.2   Obstructive 0 0 0  61 0 61  61 0 61   Central 0 0 0  0 0 0  0 0 0   Mixed 0 0 0  0 0 0  0 0 0   Hypopnea 5 0 5  51 2 53  56 2 58   REM A + H 0 0 0  0 0 0  0 0 0   REM AHI 0.0 0.0 0.0  0.0 0.0 0.0  0 0 0     SpO2 Statistics SpO2 - Pre Tx  SpO2 - Post Tx  SpO2 - Total    Min Max Mean  Min Max Mean  Min Max Mean   NREM 85 92 88  90 95 93  85 95 91   REM 0 0 0  92 95 93  92 95 93   Total 85 95 89  90 96 93  85 96 91      Pre Tx Post Tx Total           Percent of TST with SpO2 < 90% 86.2 0.0 37.9          PLMS Summary   PLMS Event Statistics Non REM  REM  Total    Pre Tx Post Tx Total  Pre Tx Post Tx Total  Pre Tx Post Tx Total   PLMS w/Arousal 0 0 0  0 0 0  0 0 0   PLMS w/Arousal Index 0.0  0.0  0.0   0.0  0.0  0.0   0 0 0   PLMS w/Awakening 0 0 0  0 0 0  0 0 0   PLMS  w/Awakening Index 0.0  0.0  0.0   0.0  0.0  0.0   0 0 0   PLMS w/Arousal + Awakening 0 0 0  0 0 0  0 0 0   PLMS w/Arousal + Awakening Index 0.0  0.0  0.0   0.0  0.0  0.0   0.0  0.0  0.0    Total PLMS 0 0 0  0 0 0  0 0 0   Total PLMS Index 0.0  0.0  0.0   0.0  0.0  0.0   0 0 0

## 2016-05-17 ENCOUNTER — Telehealth: Payer: Self-pay | Admitting: *Deleted

## 2016-05-17 DIAGNOSIS — G4733 Obstructive sleep apnea (adult) (pediatric): Secondary | ICD-10-CM

## 2016-05-17 NOTE — Telephone Encounter (Signed)
I have spoken with Edwin Craig this morning and per RAS, reviewed sleep study results. He verbalized understanding of same and is agreeable with starting CPAP.  Orders faxed to [facility]/fim

## 2016-07-03 ENCOUNTER — Encounter: Payer: Self-pay | Admitting: Neurology

## 2016-07-04 ENCOUNTER — Ambulatory Visit (INDEPENDENT_AMBULATORY_CARE_PROVIDER_SITE_OTHER): Payer: Commercial Managed Care - HMO | Admitting: Neurology

## 2016-07-04 ENCOUNTER — Encounter: Payer: Self-pay | Admitting: Neurology

## 2016-07-04 VITALS — BP 112/78 | HR 78 | Resp 16 | Ht 72.0 in | Wt 275.0 lb

## 2016-07-04 DIAGNOSIS — G4733 Obstructive sleep apnea (adult) (pediatric): Secondary | ICD-10-CM | POA: Diagnosis not present

## 2016-07-04 DIAGNOSIS — Z6838 Body mass index (BMI) 38.0-38.9, adult: Secondary | ICD-10-CM

## 2016-07-04 MED ORDER — FLUTICASONE PROPIONATE 50 MCG/ACT NA SUSP: 2.0000 | Freq: Every day | NASAL | 11 refills | Status: DC

## 2016-07-04 NOTE — Progress Notes (Signed)
[facility]  PATIENT: Edwin Craig DOB: 08-Oct-1980  REFERRING DOCTOR OR PCP:  Dr. Gregary Signs SOURCE: patient, notes from Dr. Tresa Endo  _________________________________   HISTORICAL  CHIEF COMPLAINT:   Chief Complaint  Patient presents with  . Sleep Apnea    CPAP compliance visit.  Sts. he has noticed he drools alot and has alot of congestion in his ears/fim    HISTORY OF PRESENT ILLNESS:  Edwin Craig is a 36 year old man with severe obstructive sleep apnea recently diagnosed. He started on CPAP +9 cm. A download performed today after about 30 days of use shows an AHI = 2.5 down from a pretreatment AHI = 49. He had excellent compliance with 97% use greater than 4 hours.    He uses a FF Mask.  He was unable to tolerate a nasal mask as he opens his mouth.    He feels less sleepy the next day.      Since starting CPAP, he notes congestion in his ears when he wakes up and it continues x many hours.     It does not clear with swallowing.    He would occasionally note congestion in his ears in the past, especially with scuba diving but it would be more transient.  He denies any fever.   He has had mild nasal congestion, helped by a nasal decongestant.     To start CPAP, he feels less sleepiness and fatigue.Marland Kitchen He also notes that he does not need to sleep as many hours to feel refreshed.   OSA History:    He was having snoring and witnessed sleep apnea by his wife. His snoring is so loud, his wife will wear ear plugs and often sleeps in a different room  During the night, he will sometimes wake up gasping for air. He notes fatigue and lethargy during the day at times.   He has some sleepiness during the evenings.   Of note, even as a teenager, he snored father had noted that he sometimes has pauses in his breathing at night. Had gasping for air at night for at least the last 5 years. Weight has increased 40 pounds since last year, but he does not think there has been  any change in his leap or snoring..   A PSG 05/14/2016 showed that the AHI was 49. He was titrated to CPAP +9 cm with a full facemask   EPWORTH SLEEPINESS SCALE  On a scale of 0 - 3 what is the chance of dozing:  Sitting and Reading:   0 Watching TV:    0 Sitting inactive in a public place: 0 Passenger in car for one hour: 0 Lying down to rest in the afternoon: 3 Sitting and talking to someone: 0 Sitting quietly after lunch:  0 In a car, stopped in traffic:  0  Total (out of 24):   3/24  (was 8/24)       REVIEW OF SYSTEMS: Constitutional: No fevers, chills, sweats, or change in appetite. Mild fatigue improved by CPAP Eyes: No visual changes, double vision, eye pain Ear, nose and throat: No hearing loss, ear pain, nasal congestion, sore throat Cardiovascular: No chest pain, palpitations Respiratory: No shortness of breath at rest or with exertion.   No wheezes.   He has OSA on CPAP GastrointestinaI: he has GERD.  No nausea, vomiting, diarrhea, abdominal pain, fecal incontinence Genitourinary: No dysuria, urinary retention or frequency.  No nocturia. Musculoskeletal: No neck pain, back pain Integumentary: No rash,  pruritus, skin lesions Neurological: as above Psychiatric: No depression at this time.  No anxiety Endocrine: No palpitations, diaphoresis, change in appetite, change in weigh or increased thirst Hematologic/Lymphatic: No anemia, purpura, petechiae. Allergic/Immunologic: No itchy/runny eyes, nasal congestion, recent allergic reactions, rashes  ALLERGIES: No Known Allergies  HOME MEDICATIONS:  Current Outpatient Prescriptions:  .  pantoprazole (PROTONIX) 40 MG tablet, , Disp: , Rfl:  .  fluticasone (FLONASE) 50 MCG/ACT nasal spray, Place 2 sprays into both nostrils daily., Disp: 16 g, Rfl: 11  PAST MEDICAL HISTORY: No past medical history on file.  PAST SURGICAL HISTORY: Past Surgical History:  Procedure Laterality Date  . FRACTURE SURGERY      FAMILY  HISTORY: Family History  Problem Relation Age of Onset  . Healthy Mother   . Healthy Father   . Healthy Sister   . Healthy Brother   . Healthy Sister     SOCIAL HISTORY:  Social History   Social History  . Marital status: Single    Spouse name: N/A  . Number of children: N/A  . Years of education: N/A   Occupational History  . Not on file.   Social History Main Topics  . Smoking status: Current Every Day Smoker    Packs/day: 0.12    Types: Cigarettes  . Smokeless tobacco: Current User  . Alcohol use Yes     Comment: 3-4 beers on the weekend  . Drug use: No  . Sexual activity: Yes   Other Topics Concern  . Not on file   Social History Narrative  . No narrative on file     PHYSICAL EXAM  Vitals:   07/04/16 1102  BP: 112/78  Pulse: 78  Resp: 16  Weight: 275 lb (124.7 kg)  Height: 6' (1.829 m)    Body mass index is 37.3 kg/m.   General: The patient is well-developed and well-nourished and in no acute distress.   Head is [place]/AT.   Pharynx is nonerythematous and Mallampati 3.       Neurologic Exam  Mental status: The patient is alert and oriented x 3 at the time of the examination. The patient has apparent normal recent and remote memory, with an apparently normal attention span and concentration ability.   Speech is normal.  Cranial nerves: Extraocular movements are full.   Facial strength is normal. No obvious hearing deficits are noted.  Motor:  Muscle bulk is normal.   Strength is normal.    Gait and station: Station is normal.   Gait is normal.        DIAGNOSTIC DATA (LABS, IMAGING, TESTING) - I reviewed patient records, labs, notes, testing and imaging myself where available.     ASSESSMENT AND PLAN  OSA (obstructive sleep apnea)  BMI 38.0-38.9,adult    1.   Since he is getting a stuffed up sensation in his ears, I will have him reduce the CPAP from +9 cm to +7 cm.   Additionally, nasal steroids can be tried (prescription for Flonase  provided) 2.    If not doing better or if worsening, consider a CT scan to determine if there is a mastoid effusion implying eustachian tube dysfunction. If this is occurring we can have him see ENT as well. 3.    Return in one year or call sooner if any problems. Gracilyn Gunia A. Epimenio FootSater, MD, PhD 07/04/2016, 11:31 AM Certified in Neurology, Clinical Neurophysiology, Sleep Medicine, Pain Medicine and Neuroimaging  [facility] [address], [address] [place],  [place] [address] [phone]

## 2017-07-04 ENCOUNTER — Ambulatory Visit: Payer: Commercial Managed Care - HMO | Admitting: Neurology

## 2018-02-19 ENCOUNTER — Ambulatory Visit: Payer: Commercial Managed Care - HMO | Admitting: Neurology

## 2018-02-19 ENCOUNTER — Encounter: Payer: Self-pay | Admitting: Neurology

## 2018-02-19 ENCOUNTER — Other Ambulatory Visit: Payer: Self-pay

## 2018-02-19 ENCOUNTER — Encounter

## 2018-02-19 VITALS — BP 132/79 | HR 78 | Resp 16 | Ht 72.0 in | Wt 302.0 lb

## 2018-02-19 DIAGNOSIS — R0683 Snoring: Secondary | ICD-10-CM | POA: Diagnosis not present

## 2018-02-19 DIAGNOSIS — G4733 Obstructive sleep apnea (adult) (pediatric): Secondary | ICD-10-CM | POA: Diagnosis not present

## 2018-02-19 NOTE — Progress Notes (Signed)
[facility]  PATIENT: Edwin Craig DOB: 02-01-1980  REFERRING DOCTOR OR PCP:  Dr. Gregary Signs SOURCE: patient, notes from Dr. Tresa Endo  _________________________________   HISTORICAL  CHIEF COMPLAINT:   Chief Complaint  Patient presents with  . Sleep Apnea    Sts. he is tolerating CPAP well since pressure was decreased from 9cm to 7cm H20 pressure./fim    HISTORY OF PRESENT ILLNESS:   Update 02/19/18: Edwin Craig is a 38 year old man with obstructive sleep apnea on CPAP.  He reports doing well.   He has been able to tolerate the CPAP better since the pressures were reduced after the last visit.    The download shows 100% compliance and excellent efficacy with AHI equals 1.4.  He denies excessive sleepiness.  He first had OSA signs around 2001 or 2002 when people noted he had severe snoring and pauses in breathing.  He would sometimes wake up with a snort/gasp.   Symptoms worsened more several years ago when he gained weight.  He had a sleep study split-night study 05/14/2016 showing an AHI = 49.    He was titrated to CPAP + and has tolerated it better going down from +9 to +7 cm H2O pressure.   I filled out the [place] form for sleep apnea.  He reports feeling less congested and the humidifier helps.    Once a week he wakes up and has trouble falling back asleep.     From 07/04/2016: Edwin Craig is a 38 year old man with severe obstructive sleep apnea recently diagnosed. He started on CPAP +9 cm. A download performed today after about 30 days of use shows an AHI = 2.5 down from a pretreatment AHI = 49. He had excellent compliance with 97% use greater than 4 hours.    He uses a FF Mask.  He was unable to tolerate a nasal mask as he opens his mouth.    He feels less sleepy the next day.      Since starting CPAP, he notes congestion in his ears whe he wakes up and it continues x many hours.     It does not clear with swallowing.    He would occasionally note congestion  in his ears in the past, especially with scuba diving but it would be more transient.  He denies any fever.   He has had mild nasal congestion, helped by a nasal decongestant.     To start CPAP, he feels less sleepiness and fatigue.Marland Kitchen He also notes that he does not need to sleep as many hours to feel refreshed.   OSA History:    He was having snoring and witnessed sleep apnea by his wife. His snoring is so loud, his wife will wear ear plugs and often sleeps in a different room  During the night, he will sometimes wake up gasping for air. He notes fatigue and lethargy during the day at times.   He has some sleepiness during the evenings.   Of note, even as a teenager, he snored father had noted that he sometimes has pauses in his breathing at night. Had gasping for air at night for at least the last 5 years. Weight has increased 40 pounds since last year, but he does not think there has been any change in his leap or snoring..   A PSG 05/14/2016 showed that the AHI was 49. He was titrated to CPAP +9 cm with a full facemask   EPWORTH SLEEPINESS SCALE  On a scale  of 0 - 3 what is the chance of dozing:  Sitting and Reading:   0 Watching TV:    0 Sitting inactive in a public place: 0 Passenger in car for one hour: 0 Lying down to rest in the afternoon: 3 Sitting and talking to someone: 0 Sitting quietly after lunch:  0 In a car, stopped in traffic:  0  Total (out of 24):   3/24  (was 8/24)       REVIEW OF SYSTEMS: Constitutional: No fevers, chills, sweats, or change in appetite. Mild fatigue improved by CPAP Eyes: No visual changes, double vision, eye pain Ear, nose and throat: No hearing loss, ear pain, nasal congestion, sore throat Cardiovascular: No chest pain, palpitations Respiratory: No shortness of breath at rest or with exertion.   No wheezes.   He has OSA on CPAP GastrointestinaI: he has GERD.  No nausea, vomiting, diarrhea, abdominal pain, fecal incontinence Genitourinary: No  dysuria, urinary retention or frequency.  No nocturia. Musculoskeletal: No neck pain, back pain Integumentary: No rash, pruritus, skin lesions Neurological: as above Psychiatric: No depression at this time.  No anxiety Endocrine: No palpitations, diaphoresis, change in appetite, change in weigh or increased thirst Hematologic/Lymphatic: No anemia, purpura, petechiae. Allergic/Immunologic: No itchy/runny eyes, nasal congestion, recent allergic reactions, rashes  ALLERGIES: No Known Allergies  HOME MEDICATIONS:  Current Outpatient Medications:  .  OVER THE COUNTER MEDICATION, [employer] brand nasal spray, Disp: , Rfl:  .  pantoprazole (PROTONIX) 40 MG tablet, , Disp: , Rfl:   PAST MEDICAL HISTORY: No past medical history on file.  PAST SURGICAL HISTORY: Past Surgical History:  Procedure Laterality Date  . FRACTURE SURGERY      FAMILY HISTORY: Family History  Problem Relation Age of Onset  . Healthy Mother   . Healthy Father   . Healthy Sister   . Healthy Brother   . Healthy Sister     SOCIAL HISTORY:  Social History   Socioeconomic History  . Marital status: Single    Spouse name: Not on file  . Number of children: Not on file  . Years of education: Not on file  . Highest education level: Not on file  Occupational History  . Not on file  Social Needs  . Financial resource strain: Not on file  . Food insecurity:    Worry: Not on file    Inability: Not on file  . Transportation needs:    Medical: Not on file    Non-medical: Not on file  Tobacco Use  . Smoking status: Current Every Day Smoker    Packs/day: 0.12    Types: Cigarettes  . Smokeless tobacco: Current User  Substance and Sexual Activity  . Alcohol use: Yes    Comment: 3-4 beers on the weekend  . Drug use: No  . Sexual activity: Yes  Lifestyle  . Physical activity:    Days per week: Not on file    Minutes per session: Not on file  . Stress: Not on file  Relationships  . Social connections:     Talks on phone: Not on file    Gets together: Not on file    Attends religious service: Not on file    Active member of club or organization: Not on file    Attends meetings of clubs or organizations: Not on file    Relationship status: Not on file  . Intimate partner violence:    Fear of current or ex partner: Not on file  Emotionally abused: Not on file    Physically abused: Not on file    Forced sexual activity: Not on file  Other Topics Concern  . Not on file  Social History Narrative  . Not on file     PHYSICAL EXAM  Vitals:   02/19/18 1143  BP: 132/79  Pulse: 78  Resp: 16  Weight: (!) 302 lb (137 kg)  Height: 6' (1.829 m)    Body mass index is 40.96 kg/m.   General: The patient is well-developed and well-nourished and in no acute distress.   Head is /AT.   Pharynx is nonerythematous and Mallampati 3.       Neurologic Exam  Mental status: The patient is alert and oriented x 3 at the time of the examination. The patient has apparent normal recent and remote memory, with an apparently normal attention span and concentration ability.   Speech is normal.  Cranial nerves: Extraocular movements are full.   Facial strength is normal. No obvious hearing deficits are noted.  Motor: Muscle bulk is normal.  Strength is normal.   Gait and station: Station is normal.   Gait is normal.  Tandem gait is normal.      DIAGNOSTIC DATA (LABS, IMAGING, TESTING) - I reviewed patient records, labs, notes, testing and imaging myself where available.     ASSESSMENT AND PLAN  No diagnosis found.    1.  Continue CPAP +7 cm H2O. 2.    I [facility] the [facility]. 3.    Return in one year or call sooner if any problems. Skyy Nilan A. Epimenio FootSater, MD, PhD 02/19/2018, 1:44 PM Certified in Neurology, Clinical Neurophysiology, Sleep Medicine, Pain Medicine and Neuroimaging  [facility] [address], [address] [place], [place] [address] [phone]

## 2018-05-06 ENCOUNTER — Telehealth: Payer: Self-pay | Admitting: Neurology

## 2018-05-06 NOTE — Telephone Encounter (Signed)
Faxed printed/signed orders to [phone]. Received fax confirmation.

## 2018-05-06 NOTE — Telephone Encounter (Signed)
We just received the fax this morning. Pending MD signature and then will fax.

## 2018-05-06 NOTE — Telephone Encounter (Signed)
Pt left a message stating he needs a new mask and [facility] said that the pt would need a new RX. Pt said, that [facility] told him they faxed over a request and has never received anything back. Can you check on this when you get a chance.

## 2018-07-17 ENCOUNTER — Encounter (HOSPITAL_COMMUNITY): Payer: Self-pay | Admitting: *Deleted

## 2018-07-17 ENCOUNTER — Emergency Department (HOSPITAL_COMMUNITY)
Admission: EM | Admit: 2018-07-17 | Discharge: 2018-07-17 | Disposition: A | Discharge status: Home / Self Care | Payer: No Typology Code available for payment source | Attending: Emergency Medicine | Admitting: Emergency Medicine

## 2018-07-17 ENCOUNTER — Emergency Department (HOSPITAL_COMMUNITY): Payer: No Typology Code available for payment source

## 2018-07-17 ENCOUNTER — Other Ambulatory Visit: Payer: Self-pay

## 2018-07-17 DIAGNOSIS — F17210 Nicotine dependence, cigarettes, uncomplicated: Secondary | ICD-10-CM | POA: Diagnosis not present

## 2018-07-17 DIAGNOSIS — Y9389 Activity, other specified: Secondary | ICD-10-CM | POA: Diagnosis not present

## 2018-07-17 DIAGNOSIS — Y990 Civilian activity done for income or pay: Secondary | ICD-10-CM | POA: Insufficient documentation

## 2018-07-17 DIAGNOSIS — X500XXA Overexertion from strenuous movement or load, initial encounter: Secondary | ICD-10-CM | POA: Diagnosis not present

## 2018-07-17 DIAGNOSIS — S82141A Displaced bicondylar fracture of right tibia, initial encounter for closed fracture: Secondary | ICD-10-CM | POA: Insufficient documentation

## 2018-07-17 DIAGNOSIS — S82401A Unspecified fracture of shaft of right fibula, initial encounter for closed fracture: Secondary | ICD-10-CM | POA: Insufficient documentation

## 2018-07-17 DIAGNOSIS — S8991XA Unspecified injury of right lower leg, initial encounter: Secondary | ICD-10-CM | POA: Diagnosis present

## 2018-07-17 DIAGNOSIS — Y929 Unspecified place or not applicable: Secondary | ICD-10-CM | POA: Diagnosis not present

## 2018-07-17 DIAGNOSIS — M25061 Hemarthrosis, right knee: Secondary | ICD-10-CM | POA: Insufficient documentation

## 2018-07-17 DIAGNOSIS — S82201A Unspecified fracture of shaft of right tibia, initial encounter for closed fracture: Secondary | ICD-10-CM

## 2018-07-17 MED ORDER — OXYCODONE-ACETAMINOPHEN 5-325 MG PO TABS
2.0000 | ORAL_TABLET | Freq: Once | ORAL | Status: AC
  Administered 2018-07-17: 2 via ORAL
  Filled 2018-07-17: qty 2

## 2018-07-17 MED ORDER — OXYCODONE-ACETAMINOPHEN 7.5-325 MG PO TABS: 1.0000 | ORAL_TABLET | ORAL | 0 refills | Status: DC | PRN
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
## 2018-07-17 NOTE — ED Notes (Signed)
Patient transported to CT 
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
## 2018-07-20 ENCOUNTER — Ambulatory Visit: Payer: Self-pay | Admitting: Student

## 2018-07-20 DIAGNOSIS — S82141A Displaced bicondylar fracture of right tibia, initial encounter for closed fracture: Secondary | ICD-10-CM | POA: Insufficient documentation

## 2018-07-21 ENCOUNTER — Other Ambulatory Visit: Payer: Self-pay

## 2018-07-21 ENCOUNTER — Encounter (HOSPITAL_COMMUNITY): Payer: Self-pay | Admitting: *Deleted

## 2018-07-21 ENCOUNTER — Other Ambulatory Visit (HOSPITAL_COMMUNITY)
Admission: RE | Admit: 2018-07-21 | Discharge: 2018-07-21 | Disposition: A | Discharge status: Home / Self Care | Payer: 59 | Source: Ambulatory Visit | Attending: Student | Admitting: Student

## 2018-07-21 DIAGNOSIS — Z01812 Encounter for preprocedural laboratory examination: Secondary | ICD-10-CM | POA: Insufficient documentation

## 2018-07-21 DIAGNOSIS — Z1159 Encounter for screening for other viral diseases: Secondary | ICD-10-CM | POA: Insufficient documentation

## 2018-07-21 LAB — SARS CORONAVIRUS 2 (TAT 6-24 HRS): SARS Coronavirus 2: NEGATIVE
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
## 2018-07-22 ENCOUNTER — Ambulatory Visit (HOSPITAL_COMMUNITY): Payer: No Typology Code available for payment source | Admitting: Anesthesiology

## 2018-07-22 ENCOUNTER — Ambulatory Visit (HOSPITAL_COMMUNITY): Payer: No Typology Code available for payment source

## 2018-07-22 ENCOUNTER — Encounter (HOSPITAL_COMMUNITY): Payer: Self-pay

## 2018-07-22 ENCOUNTER — Other Ambulatory Visit: Payer: Self-pay

## 2018-07-22 ENCOUNTER — Observation Stay (HOSPITAL_COMMUNITY)
Admission: RE | Admit: 2018-07-22 | Discharge: 2018-07-24 | Disposition: A | Discharge status: Home / Self Care | Payer: No Typology Code available for payment source | Attending: Student | Admitting: Student

## 2018-07-22 ENCOUNTER — Encounter (HOSPITAL_COMMUNITY)
Admission: RE | Disposition: A | Discharge status: Home / Self Care | Payer: Self-pay | Source: Home / Self Care | Attending: Student

## 2018-07-22 ENCOUNTER — Observation Stay (HOSPITAL_COMMUNITY): Payer: No Typology Code available for payment source

## 2018-07-22 DIAGNOSIS — F4310 Post-traumatic stress disorder, unspecified: Secondary | ICD-10-CM | POA: Insufficient documentation

## 2018-07-22 DIAGNOSIS — F17210 Nicotine dependence, cigarettes, uncomplicated: Secondary | ICD-10-CM | POA: Insufficient documentation

## 2018-07-22 DIAGNOSIS — K219 Gastro-esophageal reflux disease without esophagitis: Secondary | ICD-10-CM | POA: Insufficient documentation

## 2018-07-22 DIAGNOSIS — Z419 Encounter for procedure for purposes other than remedying health state, unspecified: Secondary | ICD-10-CM

## 2018-07-22 DIAGNOSIS — S82141A Displaced bicondylar fracture of right tibia, initial encounter for closed fracture: Principal | ICD-10-CM | POA: Insufficient documentation

## 2018-07-22 DIAGNOSIS — X501XXA Overexertion from prolonged static or awkward postures, initial encounter: Secondary | ICD-10-CM | POA: Diagnosis not present

## 2018-07-22 DIAGNOSIS — Y9389 Activity, other specified: Secondary | ICD-10-CM | POA: Diagnosis not present

## 2018-07-22 DIAGNOSIS — Z6838 Body mass index (BMI) 38.0-38.9, adult: Secondary | ICD-10-CM | POA: Insufficient documentation

## 2018-07-22 DIAGNOSIS — Z79899 Other long term (current) drug therapy: Secondary | ICD-10-CM | POA: Diagnosis not present

## 2018-07-22 DIAGNOSIS — Z7982 Long term (current) use of aspirin: Secondary | ICD-10-CM | POA: Insufficient documentation

## 2018-07-22 DIAGNOSIS — M25559 Pain in unspecified hip: Secondary | ICD-10-CM

## 2018-07-22 DIAGNOSIS — S82142A Displaced bicondylar fracture of left tibia, initial encounter for closed fracture: Secondary | ICD-10-CM

## 2018-07-22 HISTORY — PX: ORIF TIBIA FRACTURE: SHX5416

## 2018-07-22 HISTORY — DX: Post-traumatic stress disorder, unspecified: F43.10

## 2018-07-22 HISTORY — DX: Myoneural disorder, unspecified: G70.9

## 2018-07-22 HISTORY — DX: Gastro-esophageal reflux disease without esophagitis: K21.9

## 2018-07-22 HISTORY — PX: ORIF TIBIA PLATEAU: SHX2132

## 2018-07-22 LAB — CBC
HCT: 50.3 % (ref 39.0–52.0)
Hemoglobin: 17.5 g/dL — ABNORMAL HIGH (ref 13.0–17.0)
MCH: 30.2 pg (ref 26.0–34.0)
MCHC: 34.8 g/dL (ref 30.0–36.0)
MCV: 86.9 fL (ref 80.0–100.0)
Platelets: 237 10*3/uL (ref 150–400)
RBC: 5.79 MIL/uL (ref 4.22–5.81)
RDW: 12.1 % (ref 11.5–15.5)
WBC: 8.2 10*3/uL (ref 4.0–10.5)
nRBC: 0 % (ref 0.0–0.2)

## 2018-07-22 LAB — CREATININE, SERUM: Creatinine, Ser: <0.3 mg/dL — ABNORMAL LOW (ref 0.61–1.24)

## 2018-07-22 SURGERY — OPEN REDUCTION INTERNAL FIXATION (ORIF) TIBIAL PLATEAU
Anesthesia: General | Laterality: Right

## 2018-07-22 MED ORDER — OXYCODONE HCL 5 MG PO TABS
5.0000 mg | ORAL_TABLET | ORAL | Status: DC | PRN
  Administered 2018-07-22 – 2018-07-23 (×4): 5 mg via ORAL
  Filled 2018-07-22 (×4): qty 1

## 2018-07-22 MED ORDER — CEFAZOLIN SODIUM-DEXTROSE 2-4 GM/100ML-% IV SOLN
2.0000 g | INTRAVENOUS | Status: AC
  Administered 2018-07-22: 2 g via INTRAVENOUS

## 2018-07-22 MED ORDER — METOCLOPRAMIDE HCL 5 MG/ML IJ SOLN: 5.0000 mg | Freq: Three times a day (TID) | INTRAMUSCULAR | Status: DC | PRN

## 2018-07-22 MED ORDER — MIDAZOLAM HCL 5 MG/5ML IJ SOLN
INTRAMUSCULAR | Status: DC | PRN
  Administered 2018-07-22: 2 mg via INTRAVENOUS

## 2018-07-22 MED ORDER — SUCCINYLCHOLINE CHLORIDE 200 MG/10ML IV SOSY
PREFILLED_SYRINGE | INTRAVENOUS | Status: DC | PRN
  Administered 2018-07-22: 150 mg via INTRAVENOUS

## 2018-07-22 MED ORDER — DOCUSATE SODIUM 100 MG PO CAPS
100.0000 mg | ORAL_CAPSULE | Freq: Two times a day (BID) | ORAL | Status: DC
  Administered 2018-07-22 – 2018-07-24 (×5): 100 mg via ORAL
  Filled 2018-07-22 (×5): qty 1

## 2018-07-22 MED ORDER — HYDROMORPHONE HCL 1 MG/ML IJ SOLN
1.0000 mg | INTRAMUSCULAR | Status: DC | PRN
  Administered 2018-07-22 – 2018-07-24 (×6): 1 mg via INTRAVENOUS
  Filled 2018-07-22 (×6): qty 1

## 2018-07-22 MED ORDER — GABAPENTIN 100 MG PO CAPS
100.0000 mg | ORAL_CAPSULE | Freq: Three times a day (TID) | ORAL | Status: DC
  Administered 2018-07-22 – 2018-07-24 (×6): 100 mg via ORAL
  Filled 2018-07-22 (×6): qty 1

## 2018-07-22 MED ORDER — HYDROMORPHONE HCL 1 MG/ML IJ SOLN
INTRAMUSCULAR | Status: AC
  Filled 2018-07-22: qty 1

## 2018-07-22 MED ORDER — SUCCINYLCHOLINE CHLORIDE 200 MG/10ML IV SOSY
PREFILLED_SYRINGE | INTRAVENOUS | Status: AC
  Filled 2018-07-22: qty 10

## 2018-07-22 MED ORDER — LIDOCAINE 2% (20 MG/ML) 5 ML SYRINGE
INTRAMUSCULAR | Status: DC | PRN
  Administered 2018-07-22: 100 mg via INTRAVENOUS

## 2018-07-22 MED ORDER — VANCOMYCIN HCL 1000 MG IV SOLR
INTRAVENOUS | Status: AC
  Filled 2018-07-22: qty 1000

## 2018-07-22 MED ORDER — METOCLOPRAMIDE HCL 5 MG PO TABS: 5.0000 mg | ORAL_TABLET | Freq: Three times a day (TID) | ORAL | Status: DC | PRN

## 2018-07-22 MED ORDER — CEFAZOLIN SODIUM-DEXTROSE 2-4 GM/100ML-% IV SOLN
2.0000 g | Freq: Three times a day (TID) | INTRAVENOUS | Status: AC
  Administered 2018-07-22 – 2018-07-23 (×3): 2 g via INTRAVENOUS
  Filled 2018-07-22 (×3): qty 100

## 2018-07-22 MED ORDER — LACTATED RINGERS IV SOLN
INTRAVENOUS | Status: DC | PRN
  Administered 2018-07-22 (×2): via INTRAVENOUS

## 2018-07-22 MED ORDER — FENTANYL CITRATE (PF) 250 MCG/5ML IJ SOLN
INTRAMUSCULAR | Status: AC
  Filled 2018-07-22: qty 5

## 2018-07-22 MED ORDER — PROPOFOL 10 MG/ML IV BOLUS
INTRAVENOUS | Status: DC | PRN
  Administered 2018-07-22: 200 mg via INTRAVENOUS
  Administered 2018-07-22: 50 mg via INTRAVENOUS

## 2018-07-22 MED ORDER — 0.9 % SODIUM CHLORIDE (POUR BTL) OPTIME
TOPICAL | Status: DC | PRN
  Administered 2018-07-22: 09:00:00 1000 mL

## 2018-07-22 MED ORDER — FENTANYL CITRATE (PF) 250 MCG/5ML IJ SOLN
INTRAMUSCULAR | Status: DC | PRN
  Administered 2018-07-22: 25 ug via INTRAVENOUS
  Administered 2018-07-22: 125 ug via INTRAVENOUS

## 2018-07-22 MED ORDER — BACITRACIN ZINC 500 UNIT/GM EX OINT
TOPICAL_OINTMENT | CUTANEOUS | Status: AC
  Filled 2018-07-22: qty 28.35

## 2018-07-22 MED ORDER — ROCURONIUM BROMIDE 10 MG/ML (PF) SYRINGE
PREFILLED_SYRINGE | INTRAVENOUS | Status: DC | PRN
  Administered 2018-07-22: 50 mg via INTRAVENOUS
  Administered 2018-07-22 (×2): 20 mg via INTRAVENOUS

## 2018-07-22 MED ORDER — DEXAMETHASONE SODIUM PHOSPHATE 10 MG/ML IJ SOLN
INTRAMUSCULAR | Status: DC | PRN
  Administered 2018-07-22: 10 mg via INTRAVENOUS

## 2018-07-22 MED ORDER — BACITRACIN 500 UNIT/GM EX OINT
TOPICAL_OINTMENT | CUTANEOUS | Status: DC | PRN
  Administered 2018-07-22: 1 via TOPICAL

## 2018-07-22 MED ORDER — ONDANSETRON HCL 4 MG/2ML IJ SOLN
4.0000 mg | Freq: Four times a day (QID) | INTRAMUSCULAR | Status: DC | PRN
  Administered 2018-07-23: 11:00:00 4 mg via INTRAVENOUS
  Filled 2018-07-22: qty 2

## 2018-07-22 MED ORDER — VANCOMYCIN HCL 1000 MG IV SOLR
INTRAVENOUS | Status: DC | PRN
  Administered 2018-07-22: 1000 mg

## 2018-07-22 MED ORDER — PROMETHAZINE HCL 25 MG/ML IJ SOLN: 6.2500 mg | INTRAMUSCULAR | Status: DC | PRN

## 2018-07-22 MED ORDER — HYDROMORPHONE HCL 1 MG/ML IJ SOLN
0.2500 mg | INTRAMUSCULAR | Status: DC | PRN
  Administered 2018-07-22 (×2): 0.25 mg via INTRAVENOUS

## 2018-07-22 MED ORDER — ROCURONIUM BROMIDE 10 MG/ML (PF) SYRINGE
PREFILLED_SYRINGE | INTRAVENOUS | Status: AC
  Filled 2018-07-22: qty 10

## 2018-07-22 MED ORDER — METHOCARBAMOL 1000 MG/10ML IJ SOLN
500.0000 mg | Freq: Four times a day (QID) | INTRAVENOUS | Status: DC | PRN
  Filled 2018-07-22: qty 5

## 2018-07-22 MED ORDER — SUGAMMADEX SODIUM 500 MG/5ML IV SOLN
INTRAVENOUS | Status: DC | PRN
  Administered 2018-07-22: 300 mg via INTRAVENOUS

## 2018-07-22 MED ORDER — ONDANSETRON HCL 4 MG PO TABS: 4.0000 mg | ORAL_TABLET | Freq: Four times a day (QID) | ORAL | Status: DC | PRN

## 2018-07-22 MED ORDER — ONDANSETRON HCL 4 MG/2ML IJ SOLN
INTRAMUSCULAR | Status: AC
  Filled 2018-07-22: qty 2

## 2018-07-22 MED ORDER — SUGAMMADEX SODIUM 500 MG/5ML IV SOLN
INTRAVENOUS | Status: AC
  Filled 2018-07-22: qty 5

## 2018-07-22 MED ORDER — TOBRAMYCIN POWD
Status: DC | PRN
  Administered 2018-07-22: 10:00:00 1.2 mg via TOPICAL

## 2018-07-22 MED ORDER — CEFAZOLIN SODIUM-DEXTROSE 2-4 GM/100ML-% IV SOLN
INTRAVENOUS | Status: AC
  Filled 2018-07-22: qty 100

## 2018-07-22 MED ORDER — TOBRAMYCIN SULFATE 1.2 G IJ SOLR
INTRAMUSCULAR | Status: AC
  Filled 2018-07-22: qty 1.2

## 2018-07-22 MED ORDER — METHOCARBAMOL 500 MG PO TABS
500.0000 mg | ORAL_TABLET | Freq: Four times a day (QID) | ORAL | Status: DC | PRN
  Administered 2018-07-22 – 2018-07-24 (×6): 500 mg via ORAL
  Filled 2018-07-22 (×6): qty 1

## 2018-07-22 MED ORDER — ONDANSETRON HCL 4 MG/2ML IJ SOLN
INTRAMUSCULAR | Status: DC | PRN
  Administered 2018-07-22: 4 mg via INTRAVENOUS

## 2018-07-22 MED ORDER — PHENYLEPHRINE 40 MCG/ML (10ML) SYRINGE FOR IV PUSH (FOR BLOOD PRESSURE SUPPORT)
PREFILLED_SYRINGE | INTRAVENOUS | Status: AC
  Filled 2018-07-22: qty 10

## 2018-07-22 MED ORDER — PROPOFOL 10 MG/ML IV BOLUS
INTRAVENOUS | Status: AC
  Filled 2018-07-22: qty 20

## 2018-07-22 MED ORDER — CHLORHEXIDINE GLUCONATE 4 % EX LIQD: 60.0000 mL | Freq: Once | CUTANEOUS | Status: DC

## 2018-07-22 MED ORDER — SODIUM CHLORIDE 0.9 % IV SOLN
INTRAVENOUS | Status: DC | PRN
  Administered 2018-07-22: 10:00:00 30 ug/min via INTRAVENOUS

## 2018-07-22 MED ORDER — ENOXAPARIN SODIUM 40 MG/0.4ML ~~LOC~~ SOLN
40.0000 mg | SUBCUTANEOUS | Status: DC
  Administered 2018-07-23 – 2018-07-24 (×2): 40 mg via SUBCUTANEOUS
  Filled 2018-07-22 (×2): qty 0.4

## 2018-07-22 MED ORDER — ACETAMINOPHEN 325 MG PO TABS
650.0000 mg | ORAL_TABLET | Freq: Four times a day (QID) | ORAL | Status: DC
  Administered 2018-07-22 – 2018-07-24 (×9): 650 mg via ORAL
  Filled 2018-07-22 (×9): qty 2

## 2018-07-22 MED ORDER — PHENYLEPHRINE 40 MCG/ML (10ML) SYRINGE FOR IV PUSH (FOR BLOOD PRESSURE SUPPORT)
PREFILLED_SYRINGE | INTRAVENOUS | Status: DC | PRN
  Administered 2018-07-22 (×5): 80 ug via INTRAVENOUS

## 2018-07-22 MED ORDER — LIDOCAINE 2% (20 MG/ML) 5 ML SYRINGE
INTRAMUSCULAR | Status: AC
  Filled 2018-07-22: qty 5

## 2018-07-22 MED ORDER — PANTOPRAZOLE SODIUM 40 MG PO TBEC
40.0000 mg | DELAYED_RELEASE_TABLET | Freq: Every day | ORAL | Status: DC
  Administered 2018-07-22 – 2018-07-24 (×3): 40 mg via ORAL
  Filled 2018-07-22 (×3): qty 1

## 2018-07-22 MED ORDER — HYDROMORPHONE HCL 1 MG/ML IJ SOLN
2.0000 mg | Freq: Once | INTRAMUSCULAR | Status: AC
  Administered 2018-07-22 (×2): 1 mg via INTRAVENOUS
  Filled 2018-07-22: qty 2

## 2018-07-22 MED ORDER — MIDAZOLAM HCL 2 MG/2ML IJ SOLN
INTRAMUSCULAR | Status: AC
  Filled 2018-07-22: qty 2

## 2018-07-22 MED ORDER — POTASSIUM CHLORIDE IN NACL 20-0.9 MEQ/L-% IV SOLN
INTRAVENOUS | Status: DC
  Administered 2018-07-22: 13:00:00 via INTRAVENOUS
  Filled 2018-07-22: qty 1000

## 2018-07-22 MED ORDER — LACTATED RINGERS IV SOLN: INTRAVENOUS | Status: DC

## 2018-07-22 SURGICAL SUPPLY — 74 items
BANDAGE ACE 4X5 VEL STRL LF (GAUZE/BANDAGES/DRESSINGS) ×2 IMPLANT
BANDAGE ACE 6X5 VEL STRL LF (GAUZE/BANDAGES/DRESSINGS) ×2 IMPLANT
BANDAGE ESMARK 6X9 LF (GAUZE/BANDAGES/DRESSINGS) ×1 IMPLANT
BIT DRILL 2.5X110 QC LCP DISP (BIT) ×1 IMPLANT
BIT DRILL CANN 2.7X625 NONSTRL (BIT) ×1 IMPLANT
BLADE CLIPPER SURG (BLADE) ×1 IMPLANT
BLADE SURG 15 STRL LF DISP TIS (BLADE) ×1 IMPLANT
BLADE SURG 15 STRL SS (BLADE) ×1
BNDG ESMARK 6X9 LF (GAUZE/BANDAGES/DRESSINGS) ×2
BNDG GAUZE ELAST 4 BULKY (GAUZE/BANDAGES/DRESSINGS) ×2 IMPLANT
BRUSH SCRUB SURG 4.25 DISP (MISCELLANEOUS) ×4 IMPLANT
CANISTER SUCT 3000ML PPV (MISCELLANEOUS) ×2 IMPLANT
CHLORAPREP W/TINT 26 (MISCELLANEOUS) ×4 IMPLANT
COVER SURGICAL LIGHT HANDLE (MISCELLANEOUS) ×2 IMPLANT
COVER WAND RF STERILE (DRAPES) ×1 IMPLANT
CUFF TOURN SGL QUICK 34 (TOURNIQUET CUFF) ×1
CUFF TRNQT CYL 34X4.125X (TOURNIQUET CUFF) ×1 IMPLANT
DRAPE C-ARM 42X72 X-RAY (DRAPES) ×2 IMPLANT
DRAPE C-ARMOR (DRAPES) ×2 IMPLANT
DRAPE ORTHO SPLIT 77X108 STRL (DRAPES) ×2
DRAPE SURG ORHT 6 SPLT 77X108 (DRAPES) ×2 IMPLANT
DRAPE U-SHAPE 47X51 STRL (DRAPES) ×2 IMPLANT
DRSG PAD ABDOMINAL 8X10 ST (GAUZE/BANDAGES/DRESSINGS) ×4 IMPLANT
ELECT REM PT RETURN 9FT ADLT (ELECTROSURGICAL) ×2
ELECTRODE REM PT RTRN 9FT ADLT (ELECTROSURGICAL) ×1 IMPLANT
GAUZE SPONGE 4X4 12PLY STRL (GAUZE/BANDAGES/DRESSINGS) ×2 IMPLANT
GLOVE BIO SURGEON STRL SZ 6.5 (GLOVE) ×6 IMPLANT
GLOVE BIO SURGEON STRL SZ7.5 (GLOVE) ×8 IMPLANT
GLOVE BIOGEL PI IND STRL 6.5 (GLOVE) ×1 IMPLANT
GLOVE BIOGEL PI IND STRL 7.5 (GLOVE) ×1 IMPLANT
GLOVE BIOGEL PI INDICATOR 6.5 (GLOVE) ×1
GLOVE BIOGEL PI INDICATOR 7.5 (GLOVE) ×1
GOWN STRL REUS W/ TWL LRG LVL3 (GOWN DISPOSABLE) ×2 IMPLANT
GOWN STRL REUS W/TWL LRG LVL3 (GOWN DISPOSABLE) ×3
IMMOBILIZER KNEE 22 UNIV (SOFTGOODS) ×2 IMPLANT
K-WIRE 1.25 TRCR POINT 150 (WIRE) ×2
KIT BASIN OR (CUSTOM PROCEDURE TRAY) ×2 IMPLANT
KIT TURNOVER KIT B (KITS) ×2 IMPLANT
KWIRE 1.25 TRCR POINT 150 (WIRE) IMPLANT
NDL SUT 6 .5 CRC .975X.05 MAYO (NEEDLE) ×1 IMPLANT
NEEDLE MAYO TAPER (NEEDLE) ×1
NS IRRIG 1000ML POUR BTL (IV SOLUTION) ×2 IMPLANT
PACK TOTAL JOINT (CUSTOM PROCEDURE TRAY) ×2 IMPLANT
PAD ARMBOARD 7.5X6 YLW CONV (MISCELLANEOUS) ×4 IMPLANT
PAD CAST 4YDX4 CTTN HI CHSV (CAST SUPPLIES) ×1 IMPLANT
PADDING CAST COTTON 4X4 STRL (CAST SUPPLIES) ×1
PADDING CAST COTTON 6X4 STRL (CAST SUPPLIES) ×2 IMPLANT
PROS LCP PLATE 6H 85MM (Plate) ×2 IMPLANT
PROSTHESIS LCP PLATE 6H 85MM (Plate) IMPLANT
SCREW CANN P.T. 44MM (Screw) ×1 IMPLANT
SCREW CANN P.T. 50MM (Screw) ×1 IMPLANT
SCREW CORTEX 3.5 34MM (Screw) ×1 IMPLANT
SCREW CORTEX 3.5 45MM (Screw) ×1 IMPLANT
SCREW LOCK CORT ST 3.5X34 (Screw) IMPLANT
SCREW LOCKING 3.5X45 (Screw) ×1 IMPLANT
STAPLER VISISTAT 35W (STAPLE) ×2 IMPLANT
SUCTION FRAZIER HANDLE 10FR (MISCELLANEOUS) ×1
SUCTION TUBE FRAZIER 10FR DISP (MISCELLANEOUS) ×1 IMPLANT
SUT ETHILON 2 0 FS 18 (SUTURE) ×2 IMPLANT
SUT ETHILON 3 0 PS 1 (SUTURE) IMPLANT
SUT FIBERWIRE #2 38 T-5 BLUE (SUTURE)
SUT VIC AB 0 CT1 27 (SUTURE)
SUT VIC AB 0 CT1 27XBRD ANBCTR (SUTURE) IMPLANT
SUT VIC AB 1 CT1 18XCR BRD 8 (SUTURE) IMPLANT
SUT VIC AB 1 CT1 27 (SUTURE) ×1
SUT VIC AB 1 CT1 27XBRD ANBCTR (SUTURE) ×1 IMPLANT
SUT VIC AB 1 CT1 8-18 (SUTURE) ×1
SUT VIC AB 2-0 CT1 27 (SUTURE) ×2
SUT VIC AB 2-0 CT1 TAPERPNT 27 (SUTURE) ×2 IMPLANT
SUTURE FIBERWR #2 38 T-5 BLUE (SUTURE) IMPLANT
TAPE STRIPS DRAPE STRL (GAUZE/BANDAGES/DRESSINGS) ×2 IMPLANT
TOWEL GREEN STERILE (TOWEL DISPOSABLE) ×4 IMPLANT
TRAY FOLEY MTR SLVR 16FR STAT (SET/KITS/TRAYS/PACK) IMPLANT
WATER STERILE IRR 1000ML POUR (IV SOLUTION) ×4 IMPLANT

## 2018-07-22 NOTE — Anesthesia Preprocedure Evaluation (Addendum)
Anesthesia Evaluation  Patient identified by MRN, date of birth, ID band Patient awake    Reviewed: Allergy & Precautions, NPO status , Patient's Chart, lab work & pertinent test results  Airway Mallampati: II  TM Distance: <3 FB Neck ROM: Full    Dental no notable dental hx. (+) Dental Advisory Given, Teeth Intact   Pulmonary Current Smoker,    Pulmonary exam normal breath sounds clear to auscultation       Cardiovascular negative cardio ROS Normal cardiovascular exam Rhythm:Regular Rate:Normal     Neuro/Psych negative neurological ROS  negative psych ROS   GI/Hepatic Neg liver ROS, GERD  ,  Endo/Other  Morbid obesity  Renal/GU negative Renal ROS  negative genitourinary   Musculoskeletal negative musculoskeletal ROS (+)   Abdominal   Peds negative pediatric ROS (+)  Hematology negative hematology ROS (+)   Anesthesia Other Findings   Reproductive/Obstetrics negative OB ROS                            Anesthesia Physical Anesthesia Plan  ASA: II  Anesthesia Plan: General   Post-op Pain Management:    Induction: Intravenous  PONV Risk Score and Plan: 2 and Ondansetron, Dexamethasone and Treatment may vary due to age or medical condition  Airway Management Planned: Oral ETT  Additional Equipment:   Intra-op Plan:   Post-operative Plan: Extubation in OR  Informed Consent: I have reviewed the patients History and Physical, chart, labs and discussed the procedure including the risks, benefits and alternatives for the proposed anesthesia with the patient or authorized representative who has indicated his/her understanding and acceptance.     Dental advisory given  Plan Discussed with: CRNA and Surgeon  Anesthesia Plan Comments:         Anesthesia Quick Evaluation

## 2018-07-22 NOTE — Anesthesia Procedure Notes (Signed)
Procedure Name: Intubation Date/Time: 07/22/2018 8:34 AM Performed by: Harden Mo, CRNA Pre-anesthesia Checklist: Patient identified, Emergency Drugs available, Suction available and Patient being monitored Patient Re-evaluated:Patient Re-evaluated prior to induction Oxygen Delivery Method: Circle System Utilized Preoxygenation: Pre-oxygenation with 100% oxygen Induction Type: IV induction and Rapid sequence Laryngoscope Size: Miller and 2 Grade View: Grade I Tube type: Oral Tube size: 7.5 mm Number of attempts: 1 Airway Equipment and Method: Stylet and Oral airway Placement Confirmation: ETT inserted through vocal cords under direct vision,  positive ETCO2 and breath sounds checked- equal and bilateral Secured at: 23 cm Tube secured with: Tape Dental Injury: Teeth and Oropharynx as per pre-operative assessment

## 2018-07-22 NOTE — Progress Notes (Signed)
Orthopedic Tech Progress Note Patient Details:  Edwin Craig 1980-07-22 [id number] Call [employer] for brace order. Patient ID: Edwin Craig, male   DOB: Apr 15, 1980, 38 y.o.   MRN: [id number]   [name] 07/22/2018, 12:23 PM

## 2018-07-22 NOTE — Plan of Care (Signed)

## 2018-07-22 NOTE — Progress Notes (Signed)
[facility] Progress Note Patient Details:  Edwin Craig September 11, 1980 [id number]  Patient ID: Mable Paris, male   DOB: 1980/07/30, 38 y.o.   MRN: [id number]   [place] Pink 07/22/2018, 6:28 PMTrapeze and bed are incompatible.

## 2018-07-22 NOTE — Transfer of Care (Signed)
Immediate Anesthesia Transfer of Care Note  Patient: Edwin Craig  Procedure(s) Performed: OPEN REDUCTION INTERNAL FIXATION (ORIF) TIBIAL PLATEAU (Right )  Patient Location: PACU  Anesthesia Type:General  Level of Consciousness: awake, alert , oriented and patient cooperative  Airway & Oxygen Therapy: Patient Spontanous Breathing and Patient connected to nasal cannula oxygen  Post-op Assessment: Report given to RN and Post -op Vital signs reviewed and stable  Post vital signs: Reviewed and stable  Last Vitals:  Vitals Value Taken Time  BP 134/74 07/22/18 1049  Temp    Pulse 82 07/22/18 1049  Resp 19 07/22/18 1049  SpO2 95 % 07/22/18 1049    Last Pain:  Vitals:   07/22/18 0631  TempSrc:   PainSc: 8       Patients Stated Pain Goal: 2 (13/08/65 7846)  Complications: No apparent anesthesia complications

## 2018-07-22 NOTE — Op Note (Addendum)
Orthopaedic Surgery Operative Note (CSN: [id number] ) Date of Surgery: 07/22/2018  Admit Date: 07/22/2018   Diagnoses: Pre-Op Diagnoses: Right bicondylar tibial plateau fracture  Post-Op Diagnosis: Same  Procedures: 1. CPT 78295-AOZH reduction internal fixation of right tibial plateau fracture 2. CPT 27540-Open reduction internal fixation of tibial tuberosity  Surgeons : Primary: Shona Needles, MD  Assistant: Patrecia Pace, PA-C  Location: OR 7   Anesthesia:General  Antibiotics: Ancef 2g preop   Tourniquet time: Total Tourniquet Time Documented: Thigh (Right) - 88 minutes Total: Thigh (Right) - 88 minutes  Estimated Blood YQMV:78 mL  Complications:None  Specimens:None   Implants: Implant Name Type Inv. Item Serial No. Manufacturer Lot No. LRB No. Used Action  PROS LCP PLATE 6H 46NG - [id number] Plate PROS LCP PLATE 6H 44WN  [employer]  Right 1 Implanted  SCREW CORTEX 3.5 45MM - [id number] Screw SCREW CORTEX 3.5 45MM  [employer]  Right 1 Implanted  SCREW CORTEX 3.5 34MM - [id number] Screw SCREW CORTEX 3.5 34MM  [employer]  Right 1 Implanted  SCREW CANN P.T. 44MM - [id number] Screw SCREW CANN P.T. 44MM  [employer]  Right 1 Implanted  SCREW LOCKING 3.5X45 - [id number] Screw SCREW LOCKING 3.5X45  [employer]  Right 1 Implanted  SCREW CANN P.T. 50MM - [id number] Screw SCREW CANN P.T. 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  X-ray showed a posterior medial plateau fracture with associated tuberosity fractures.  I recommend proceeding with open reduction internal fixation of his right tibial plateau fracture as well as his tuberosity to repair his ACL stump.  Risks and benefits were discussed with the patient.  Risks included but not limited to bleeding, infection, malunion, nonunion, hardware failure, knee stiffness, knee  arthritis, nerve and blood vessel injury, DVT.  The patient agreed to proceed with surgery and consent was obtained.  Operative Findings: 1.  Open reduction internal fixation of posterior medial tibial plateau fracture using a Synthes 6-hole 3.5 mm LCP buttress plate along the posterior medial cortex 2.  Open reduction internal fixation of anterior tuberosity fracture using a Synthes 3.5 mm cannulated screws through anterior lateral accessory incision/arthrotomy.  Procedure: The patient was identified in the preoperative holding area. Consent was confirmed with the patient and their family and all questions were answered. The operative extremity was marked after confirmation with the patient. he was then brought back to the operating room by our anesthesia colleagues.  He was carefully transferred over to a radiolucent flat top table.  Was placed under general anesthetic.  A bump was placed under his operative hip.  A nonsterile tourniquet was placed to his upper thigh. The operative extremity was then prepped and draped in usual sterile fashion. A preoperative timeout was performed to verify the patient, the procedure, and the extremity. Preoperative antibiotics were dosed.  A standard posterior medial incision was carried through skin and subcutaneous tissue after the Esmarch was used to exsanguinate the leg and tourniquet was inflated.  I carried through skin subcutaneous tissue until I identified the crural fascia.  I incised this on the posterior aspect of the tibia and released it along with the posterior musculature performing a subperiosteal dissection.  I mobilized the hamstring tendons and released and freed any attachments to be able to mobilize some to visualize the posterior medial aspect of the tibia.  A reduction tenaculum was then used to clamp the posterior medial fragment and a percutaneous incision was made along the anterior aspect of the tibia to reduce it.  It was held provisionally  with 1.6 mm K wires from anterior to posterior.  A 6 hole Synthes LCP 3.5 mm plate was then contoured to fit the posterior medial aspect of the proximal plateau.  It was held provisionally with K wires and a nonlocking screw was placed in the apex of the fracture.  It provided excellent buttress.  Another tibial shaft screw was placed.  Fluoroscopic images were obtained to show adequate reduction of the joint as well as adequate placement of the plate.  I then turned my attention to an anterior lateral approach.  Just lateral to the patella and patellar tendon I made a incision carried this down through skin and subcutaneous tissue until I released the retinaculum and performed a arthrotomy above the meniscus.  Here was able to release and removed some anterior fat pad and was able to palpate the reduction of the avulsed ACL stump.  I reduced this back down and held provisionally with 1.25 mm K wires from the cannulated screw set.  I advanced these K wires until they were bicortical measured and placed 3.5 mm partially-threaded cannulated screws to hold this fragment in place.  The knee was then carried through a gentle range of motion.  There is no displacement and the fragment felt stable until 90 degrees of flexion.  I did not push it past that.  I returned to the medial side and placed a locking screw in the proximal segment of the posterior medial cortex.  Final fluoroscopic images were obtained.  The incisions were copiously irrigated.  A gram of vancomycin and 1.2 g of tobramycin powder were placed into the incisions.  The tourniquet was deflated.  The the incisions were closed with 0 Vicryl, 2-0 Vicryl and 3-0 Monocryl.  The skin was dressed with Steri-Strips as well as 4 x 4's and wrapped in sterile cast padding and Ace wraps.  He was placed back in his knee immobilizer.  The patient was then awoken from anesthesia and taken to PACU in stable condition.  Post Op Plan/Instructions: The patient will  be nonweightbearing to the right lower extremity.  He will be admitted for postoperative pain control and observation.  He will be mobilized with physical therapy.  He will given 2 g of Ancef postoperatively.  He will receive Lovenox for DVT prophylaxis while in-house with discharge on likely aspirin 325 mg.  I was present and performed the entire surgery.  Ulyses SouthwardSarah Yacobi, PA-C did assist me throughout the case. An assistant was necessary given the difficulty in approach, maintenance of reduction and ability to instrument the fracture.   Truitt MerleKevin Azara Gemme, MD [facility]

## 2018-07-22 NOTE — Anesthesia Postprocedure Evaluation (Signed)
Anesthesia Post Note  Patient: Edwin Craig  Procedure(s) Performed: OPEN REDUCTION INTERNAL FIXATION (ORIF) TIBIAL PLATEAU (Right )     Patient location during evaluation: PACU Anesthesia Type: General Level of consciousness: awake and alert Pain management: pain level controlled Vital Signs Assessment: post-procedure vital signs reviewed and stable Respiratory status: spontaneous breathing, nonlabored ventilation, respiratory function stable and patient connected to nasal cannula oxygen Cardiovascular status: blood pressure returned to baseline and stable Postop Assessment: no apparent nausea or vomiting Anesthetic complications: no    Last Vitals:  Vitals:   07/22/18 1145 07/22/18 1212  BP: 136/85 129/82  Pulse: 97 80  Resp: (!) 21 15  Temp: 36.7 C 36.9 C  SpO2: 92% 95%    Last Pain:  Vitals:   07/22/18 1300  TempSrc:   PainSc: 10-Worst pain ever                 [name]

## 2018-07-22 NOTE — Progress Notes (Signed)
[facility] Progress Note Patient Details:  Edwin Craig 06/23/1980 [id number]  Patient ID: Mable Paris, male   DOB: [birth year], 38 y.o.   MRN: [id number]   [employer] for right hinged knee brace. 07/22/2018, 3:02 PM

## 2018-07-23 ENCOUNTER — Encounter (HOSPITAL_COMMUNITY): Payer: Self-pay | Admitting: Student

## 2018-07-23 DIAGNOSIS — S82141A Displaced bicondylar fracture of right tibia, initial encounter for closed fracture: Secondary | ICD-10-CM | POA: Diagnosis not present

## 2018-07-23 LAB — BASIC METABOLIC PANEL
Anion gap: 9 (ref 5–15)
BUN: 16 mg/dL (ref 6–20)
CO2: 23 mmol/L (ref 22–32)
Calcium: 8.7 mg/dL — ABNORMAL LOW (ref 8.9–10.3)
Chloride: 103 mmol/L (ref 98–111)
Creatinine, Ser: 1.26 mg/dL — ABNORMAL HIGH (ref 0.61–1.24)
GFR calc Af Amer: >60 mL/min (ref >60)
GFR calc non Af Amer: >60 mL/min (ref >60)
Glucose, Bld: 152 mg/dL — ABNORMAL HIGH (ref 70–99)
Potassium: 3.8 mmol/L (ref 3.5–5.1)
Sodium: 135 mmol/L (ref 135–145)

## 2018-07-23 LAB — CBC
HCT: 45.4 % (ref 39.0–52.0)
Hemoglobin: 15.7 g/dL (ref 13.0–17.0)
MCH: 29.8 pg (ref 26.0–34.0)
MCHC: 34.6 g/dL (ref 30.0–36.0)
MCV: 86.1 fL (ref 80.0–100.0)
Platelets: 264 10*3/uL (ref 150–400)
RBC: 5.27 MIL/uL (ref 4.22–5.81)
RDW: 12 % (ref 11.5–15.5)
WBC: 12.9 10*3/uL — ABNORMAL HIGH (ref 4.0–10.5)
nRBC: 0 % (ref 0.0–0.2)

## 2018-07-23 MED ORDER — OXYCODONE HCL 5 MG PO TABS
5.0000 mg | ORAL_TABLET | ORAL | Status: DC | PRN
  Administered 2018-07-23 – 2018-07-24 (×5): 10 mg via ORAL
  Administered 2018-07-24: 5 mg via ORAL
  Administered 2018-07-24: 05:00:00 10 mg via ORAL
  Filled 2018-07-23 (×7): qty 2

## 2018-07-23 MED FILL — Tobramycin Sulfate For Inj 1.2 GM: INTRAMUSCULAR | Qty: 1.2 | Status: AC

## 2018-07-23 NOTE — Discharge Instructions (Signed)
[facility] Discharge Instructions   General Discharge Instructions  WEIGHT BEARING STATUS: Non-weightbearing on right leg  RANGE OF MOTION/ACTIVITY: Okay to unlock hinge knee brace and work on knee range of motion 0-60 degrees for first 2 weeks. Knee range of motion 0-90 degrees week 2-4. Unrestricted knee range of motion after 4 weeks  Wound Care: Incisions can be left open to air if there is no drainage. Okay to shower and let steri-strips get wet. Do not pull steri-strips off, they will fall off on their own  DVT/PE prophylaxis: Aspirin 325 mg daily  Diet: as you were eating previously.  Can use over the counter stool softeners and bowel preparations, such as Miralax, to help with bowel movements.  Narcotics can be constipating.  Be sure to drink plenty of fluids  PAIN MEDICATION USE AND EXPECTATIONS  You have likely been given narcotic medications to help control your pain.  After a traumatic event that results in an fracture (broken bone) with or without surgery, it is ok to use narcotic pain medications to help control one's pain.  We understand that everyone responds to pain differently and each individual patient will be evaluated on a regular basis for the continued need for narcotic medications. Ideally, narcotic medication use should last no more than 6-8 weeks (coinciding with fracture healing).   As a patient it is your responsibility as well to monitor narcotic medication use and report the amount and frequency you use these medications when you come to your office visit.   We would also advise that if you are using narcotic medications, you should take a dose prior to therapy to maximize you participation.  IF YOU ARE ON NARCOTIC MEDICATIONS IT IS NOT PERMISSIBLE TO OPERATE A MOTOR VEHICLE (MOTORCYCLE/CAR/TRUCK/MOPED) OR HEAVY MACHINERY DO NOT MIX NARCOTICS WITH OTHER CNS (CENTRAL NERVOUS SYSTEM) DEPRESSANTS SUCH AS ALCOHOL   STOP SMOKING OR USING NICOTINE  PRODUCTS!!!!  As discussed nicotine severely impairs your body's ability to heal surgical and traumatic wounds but also impairs bone healing.  Wounds and bone heal by forming microscopic blood vessels (angiogenesis) and nicotine is a vasoconstrictor (essentially, shrinks blood vessels).  Therefore, if vasoconstriction occurs to these microscopic blood vessels they essentially disappear and are unable to deliver necessary nutrients to the healing tissue.  This is one modifiable factor that you can do to dramatically increase your chances of healing your injury.    (This means no smoking, no nicotine gum, patches, etc)  DO NOT USE NONSTEROIDAL ANTI-INFLAMMATORY DRUGS (NSAID'S)  Using products such as Advil (ibuprofen), Aleve (naproxen), Motrin (ibuprofen) for additional pain control during fracture healing can delay and/or prevent the healing response.  If you would like to take over the counter (OTC) medication, Tylenol (acetaminophen) is ok.  However, some narcotic medications that are given for pain control contain acetaminophen as well. Therefore, you should not exceed more than 4000 mg of tylenol in a day if you do not have liver disease.  Also note that there are may OTC medicines, such as cold medicines and allergy medicines that my contain tylenol as well.  If you have any questions about medications and/or interactions please ask your doctor/PA or your pharmacist.      ICE AND ELEVATE INJURED/OPERATIVE EXTREMITY  Using ice and elevating the injured extremity above your heart can help with swelling and pain control.  Icing in a pulsatile fashion, such as 20 minutes on and 20 minutes off, can be followed.    Do not  place ice directly on skin. Make sure there is a barrier between to skin and the ice pack.    Using frozen items such as frozen peas works well as the conform nicely to the are that needs to be iced.  USE AN ACE WRAP OR TED HOSE FOR SWELLING CONTROL  In addition to icing and elevation,  Ace wraps or TED hose are used to help limit and resolve swelling.  It is recommended to use Ace wraps or TED hose until you are informed to stop.    When using Ace Wraps start the wrapping distally (farthest away from the body) and wrap proximally (closer to the body)   Example: If you had surgery on your leg or thing and you do not have a splint on, start the ace wrap at the toes and work your way up to the thigh        If you had surgery on your upper extremity and do not have a splint on, start the ace wrap at your fingers and work your way up to the upper arm   [place]: [phone]   VISIT OUR WEBSITE FOR ADDITIONAL INFORMATION: [email or web address]     Discharge Wound Care Instructions  Do NOT apply any ointments, solutions or lotions to pin sites or surgical wounds.  These prevent needed drainage and even though solutions like hydrogen peroxide kill bacteria, they also damage cells lining the pin sites that help fight infection.  Applying lotions or ointments can keep the wounds moist and can cause them to breakdown and open up as well. This can increase the risk for infection. When in doubt call the office.  Surgical incisions should be dressed daily.  If any drainage is noted, use one layer of adaptic, then gauze, Kerlix, and an ace wrap.  Once the incision is completely dry and without drainage, it may be left open to air out.  Showering may begin 36-48 hours later.  Cleaning gently with soap and water.  Traumatic wounds should be dressed daily as well.    One layer of adaptic, gauze, Kerlix, then ace wrap.  The adaptic can be discontinued once the draining has ceased    If you have a wet to dry dressing: wet the gauze with saline the squeeze as much saline out so the gauze is moist (not soaking wet), place moistened gauze over wound, then place a dry gauze over the moist one, followed by Kerlix wrap, then ace wrap.

## 2018-07-23 NOTE — Progress Notes (Signed)
Occupational Therapy Evaluation Patient Details Name: Edwin BoronMatthew L Craig MRN: [id number] DOB: July 31, 1980 Today's Date: 07/23/2018    History of Present Illness Pt is a 38 y.o. 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 Pt sustained R tibial plateau fx, now s/p tibial ORIF 7/8. PMH includes L shoulder fx (2012), PTSD.   Clinical Impression   Pt s/p procedure listed above. PTA, pt is independent and lives at home with wife and young children.  Pt able to maintain NWB status throughout session with min guard using RW.  Limited by pain and nausea today.  Min-mod assist for LB ADLs due to left LE pain and limited ROM. Pt has a walk in shower and a tub shower. Therapist discussed recommendation of shower chair in order to assist pt with maintaining NWB status while showering, and pt agreeable to this recommendation.  Began AE education but unable to complete due to pt nausea. Will complete education next session. Will continue to follow acutely in order to maximize independence and safety with ADLs prior to discharge home with family.     Follow Up Recommendations  No OT follow up;Supervision - Intermittent    Equipment Recommendations  Tub/shower seat    Recommendations for Other Services       Precautions / Restrictions Precautions Precautions: Fall Required Braces or Orthoses: Other Brace Other Brace: R knee bledsoe brace unlocked with 0-60' ROM permited (for first 2 weeks) Restrictions Weight Bearing Restrictions: Yes RLE Weight Bearing: Non weight bearing      Mobility Bed Mobility Overal bed mobility: (pt up in chair)  Transfers Overall transfer level: Needs assistance Equipment used: Rolling walker (2 wheeled) Transfers: Sit to/from Stand Sit to Stand: Min guard         General transfer comment: Wide base of support.  Independent with correct hand placement.     Balance                           ADL either  performed or assessed with clinical judgement   ADL Overall ADL's : Needs assistance/impaired Eating/Feeding: Independent;Sitting   Grooming: Min guard;Standing   Upper Body Bathing: Independent;Sitting   Lower Body Bathing: Minimal assistance;Sit to/from stand   Upper Body Dressing : Independent;Sitting   Lower Body Dressing: Moderate assistance;Sit to/from stand   Toilet Transfer: Min guard;Ambulation;RW;Comfort height toilet;Grab bars   Toileting- Clothing Manipulation and Hygiene: Min guard;Sit to/from stand       Functional mobility during ADLs: Min guard;Rolling walker General ADL Comments: Therapist demonstrated use of reacher for LB dressing. Pt limited by nausea and pain during session.     Vision         Perception     Praxis      Pertinent Vitals/Pain Pain Assessment: 0-10 Pain Score: 5  Pain Location: RLE Pain Descriptors / Indicators: Grimacing;Guarding Pain Intervention(s): Limited activity within patient's tolerance;Patient requesting pain meds-RN notified     Hand Dominance     Extremity/Trunk Assessment Upper Extremity Assessment Upper Extremity Assessment: Overall WFL for tasks assessed   Lower Extremity Assessment Lower Extremity Assessment: Defer to PT evaluation    Cervical / Trunk Assessment Cervical / Trunk Assessment: Normal   Communication Communication Communication: No difficulties   Cognition Arousal/Alertness: Awake/alert Behavior During Therapy: WFL for tasks assessed/performed Overall Cognitive Status: Within Functional Limits for tasks assessed  General Comments    Exercises   Shoulder Instructions      Home Living Family/patient expects to be discharged to:: Private residence Living Arrangements: Spouse/significant other;Children Available Help at Discharge: Family Type of Home: House Home Access: Stairs to enter Technical brewer of Steps: 3 Entrance  Stairs-Rails: None Home Layout: Two level;1/2 bath on main level Alternate Level Stairs-Number of Steps: Flight   Bathroom Shower/Tub: Tub/shower unit;Walk-in shower(showers are on second level)   Biochemist, clinical: Standard     Home Equipment: Crutches   Additional Comments: Wife works as Pharmacist, hospital, so has been home and able to assist.Two young children at home      Prior Functioning/Environment Level of Independence: Independent        Comments: Pt works as Engineer, structural, independent with mobility. Initial injury happened 7/3, pt at home with wife assisting, staying on main level, sleeping on couch and wife sponge bathing in half bath        OT Problem List: Pain;Decreased knowledge of use of DME or AE      OT Treatment/Interventions: Self-care/ADL training;DME and/or AE instruction;Therapeutic activities;Patient/family education    OT Goals(Current goals can be found in the care plan section) Acute Rehab OT Goals Patient Stated Goal: Get back to work OT Goal Formulation: With patient Time For Goal Achievement: 07/30/18 Potential to Achieve Goals: Good  OT Frequency: Min 2X/week   Barriers to D/C:            Co-evaluation              AM-PAC OT "6 Clicks" Daily Activity     Outcome Measure Help from another person eating meals?: None Help from another person taking care of personal grooming?: A Little(standing) Help from another person toileting, which includes using toliet, bedpan, or urinal?: A Little Help from another person bathing (including washing, rinsing, drying)?: A Little Help from another person to put on and taking off regular upper body clothing?: None Help from another person to put on and taking off regular lower body clothing?: A Lot 6 Click Score: 19   End of Session Equipment Utilized During Treatment: Rolling walker Nurse Communication: Mobility status;Patient requests pain meds  Activity Tolerance: Patient limited by pain(limited by  nausea) Patient left: in chair;with call bell/phone within reach  OT Visit Diagnosis: Unsteadiness on feet (R26.81);Pain Pain - Right/Left: Right Pain - part of body: Leg                Time: 6063-0160 OT Time Calculation (min): 25 min Charges:  OT General Charges $OT Visit: 1 Visit OT Evaluation $OT Eval Low Complexity: 1 Low OT Treatments $Self Care/Home Management : 8-22 mins   Darrol Jump OTR/L [place] [phone] 07/23/2018, 9:43 AM

## 2018-07-23 NOTE — Care Management (Signed)
Case manager acknowledges Consult. Left message with patient's Artist. [phone] to begin request for [facility] and DME . Will continue to monitor.      Ricki Miller, RN Case Manager [phone]

## 2018-07-23 NOTE — Discharge Summary (Signed)
Orthopaedic Trauma Service (OTS) Discharge Summary   Patient ID: Edwin Craig MRN: [id number] DOB/AGE: 1981/01/02 38 y.o.  Admit date: 07/22/2018 Discharge date: 07/24/2018  Admission Diagnoses: Right tibial plateau fracture    Discharge Diagnoses:  Principal Problem:   Closed bicondylar fracture of right tibial plateau Active Problems:   Tibial plateau fracture, right   Past Medical History:  Diagnosis Date  . GERD (gastroesophageal reflux disease)   . Neuromuscular disorder (HCC)    left shoulder ? "like it asleep." had a nerve test , tolod it wass fine." started after shoulder surgery  . PTSD (post-traumatic stress disorder)      Procedures Performed: ORIF right tibial plateau  Discharged Condition: stable/good  Hospital Course: Patient presented to [facility] 07/22/2018 for planned surgical fixation of right tibial plateau fracture. Patient taken to operating room by Dr. Jena GaussHaddix for above procedure. Tolerated procedure well. Was transitioned to a hinge knee brace and made non-weightbearing on right lower extremity post-operatively. Was admitted to the hospital overnight for observation and pain control. Was started on Lovenox for DVT prophylaxis starting on POD #1. Began working with therapies starting on POD #1.  On 07/24/2018, the patient was tolerating diet, working well with therapies, pain well controlled, vital signs stable, dressings clean, dry, intact and felt stable for discharge to home. Patient will follow up as below and knows to call with questions or concerns.     Consults: None  Significant Diagnostic Studies: None  Results for orders placed or performed during the hospital encounter of 07/22/18 (from the past 168 hour(s))  CBC   Collection Time: 07/22/18  6:23 AM  Result Value Ref Range   WBC 8.2 4.0 - 10.5 K/uL   RBC 5.79 4.22 - 5.81 MIL/uL   Hemoglobin 17.5 (H) 13.0 - 17.0 g/dL   HCT 40.950.3 81.139.0 - 91.452.0 %   MCV 86.9 80.0 - 100.0  fL   MCH 30.2 26.0 - 34.0 pg   MCHC 34.8 30.0 - 36.0 g/dL   RDW 78.212.1 95.611.5 - 21.315.5 %   Platelets 237 150 - 400 K/uL   nRBC 0.0 0.0 - 0.2 %  Creatinine, serum   Collection Time: 07/22/18  6:23 AM  Result Value Ref Range   Creatinine, Ser <0.30 (L) 0.61 - 1.24 mg/dL   GFR calc non Af Amer NOT CALCULATED >60 mL/min   GFR calc Af Amer NOT CALCULATED >60 mL/min  CBC   Collection Time: 07/23/18  2:54 AM  Result Value Ref Range   WBC 12.9 (H) 4.0 - 10.5 K/uL   RBC 5.27 4.22 - 5.81 MIL/uL   Hemoglobin 15.7 13.0 - 17.0 g/dL   HCT 08.645.4 57.839.0 - 46.952.0 %   MCV 86.1 80.0 - 100.0 fL   MCH 29.8 26.0 - 34.0 pg   MCHC 34.6 30.0 - 36.0 g/dL   RDW 62.912.0 52.811.5 - 41.315.5 %   Platelets 264 150 - 400 K/uL   nRBC 0.0 0.0 - 0.2 %  Basic metabolic panel   Collection Time: 07/23/18  2:54 AM  Result Value Ref Range   Sodium 135 135 - 145 mmol/L   Potassium 3.8 3.5 - 5.1 mmol/L   Chloride 103 98 - 111 mmol/L   CO2 23 22 - 32 mmol/L   Glucose, Bld 152 (H) 70 - 99 mg/dL   BUN 16 6 - 20 mg/dL   Creatinine, Ser 2.441.26 (H) 0.61 - 1.24 mg/dL   Calcium 8.7 (L) 8.9 - 10.3 mg/dL  GFR calc non Af Amer >60 >60 mL/min   GFR calc Af Amer >60 >60 mL/min   Anion gap 9 5 - 15  Basic metabolic panel   Collection Time: 07/24/18  2:51 AM  Result Value Ref Range   Sodium 138 135 - 145 mmol/L   Potassium 4.1 3.5 - 5.1 mmol/L   Chloride 106 98 - 111 mmol/L   CO2 24 22 - 32 mmol/L   Glucose, Bld 107 (H) 70 - 99 mg/dL   BUN 15 6 - 20 mg/dL   Creatinine, Ser 1.13 0.61 - 1.24 mg/dL   Calcium 8.2 (L) 8.9 - 10.3 mg/dL   GFR calc non Af Amer >60 >60 mL/min   GFR calc Af Amer >60 >60 mL/min   Anion gap 8 5 - 15  Results for orders placed or performed during the hospital encounter of 07/21/18 (from the past 168 hour(s))  SARS Coronavirus 2 (Performed in [place] hospital lab)   Collection Time: 07/21/18  1:17 PM   Specimen: Nasal Swab  Result Value Ref Range   SARS Coronavirus 2 NEGATIVE NEGATIVE     Treatments: surgery:  ORIF right tibial plateau  Discharge Exam: General - Sitting up in bed, NAD Cardiac - Heart regular rate and rhythm Respiratory - No increased work of breathing. Lungs CTA anterior lung fields bilaterally Right Lower Extremity - Dressing removed, incisions are clean, dry, intact with steri-strips in place. Hinge brace in place. Tenderness with palpation of knee and lower leg. Sensation intact to light touch of extremity. Plantarflexion/dorsiflexion intact. Small amount of knee flexion tolerated. Lower leg swollen but compartments compressible.  2+ DP pulse   Disposition: Discharge disposition: 01-Home or Self Care        Allergies as of 07/24/2018   No Known Allergies     Medication List    STOP taking these medications   oxyCODONE-acetaminophen 7.5-325 MG tablet Commonly known as: Percocet Replaced by: oxyCODONE-acetaminophen 5-325 MG tablet     TAKE these medications   aspirin EC 325 MG tablet Take 1 tablet (325 mg total) by mouth daily.   gabapentin 100 MG capsule Commonly known as: NEURONTIN Take 1 capsule (100 mg total) by mouth 3 (three) times daily.   methocarbamol 500 MG tablet Commonly known as: ROBAXIN Take 1 tablet (500 mg total) by mouth every 6 (six) hours as needed for muscle spasms.   oxyCODONE-acetaminophen 5-325 MG tablet Commonly known as: Percocet Take 1 tablet by mouth every 4 (four) hours as needed for severe pain. Replaces: oxyCODONE-acetaminophen 7.5-325 MG tablet   pantoprazole 40 MG tablet Commonly known as: PROTONIX Take 40 mg by mouth daily.            Durable Medical Equipment  (From admission, onward)         Start     Ordered   07/23/18 1355  For home use only DME Tub bench  Once     07/23/18 1355   07/23/18 1355  For home use only DME Walker rolling  Once    Question:  Patient needs a walker to treat with the following condition  Answer:  Right tibial fracture   07/23/18 1355           Discharge Instructions and  Plan: Patient will be discharged to home. Will be discharged on Aspirin 325 mg daily x 30 days for DVT prophylaxis. Patient has been provided with all the necessary DME for discharge. Patient will follow up with Dr. Doreatha Martin in 2  weeks for repeat x-rays and wound check.     Signed:  Shawn RouteSarah A. Ladonna SnideYacobi, PA-C ?[phone]? (phone) 07/24/2018, 10:44 AM  [facility] [address] [place] [address] [phone](O) [phone] (F)

## 2018-07-23 NOTE — Evaluation (Signed)
Physical Therapy Evaluation Patient Details Name: Edwin BoronMatthew L Musil MRN: [id number] DOB: 04-06-1980 Today's Date: 07/23/2018   History of Present Illness  Pt is a 38 y.o. 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 Pt sustained R tibial plateau fx, now s/p tibial ORIF 7/8. PMH includes L shoulder fx (2012), PTSD.    Clinical Impression  Pt presents with an overall decrease in functional mobility secondary to above. PTA, pt indep, works and lives with family. Since initial injury 07/17/18, pt has been ambulating minimally with crutches and assist from wife. Educ on precautions, positioning, brace wear/ROM restrictions, therex, and importance of mobility. Today, pt able to initiate gait training with RW and bilateral crutches; limited by pain. Able to tolerate ~5-50' R knee ROM. Will plan for additional session tomorrow for stair training. Pt would benefit from continued acute PT services to maximize functional mobility and independence prior to d/c home.     Follow Up Recommendations No PT follow up;Supervision for mobility/OOB(outpatient ortho PT when WB orders increase)    Equipment Recommendations  Rolling walker with 5" wheels    Recommendations for Other Services       Precautions / Restrictions Precautions Precautions: Fall Required Braces or Orthoses: Other Brace Other Brace: R knee bledsoe brace unlocked with 0-60' ROM permited (for first 2 weeks) Restrictions Weight Bearing Restrictions: Yes RLE Weight Bearing: Non weight bearing      Mobility  Bed Mobility Overal bed mobility: Needs Assistance Bed Mobility: Supine to Sit     Supine to sit: Min assist     General bed mobility comments: MinA to help lower RLE to ground  Transfers Overall transfer level: Needs assistance Equipment used: Rolling walker (2 wheeled);Crutches Transfers: Sit to/from Stand Sit to Stand: Supervision         General transfer comment: Cues for  hand placement; good ability to stand with both RW and crutches  Ambulation/Gait Ambulation/Gait assistance: Min guard Gait Distance (Feet): 5 Feet Assistive device: Rolling walker (2 wheeled);Crutches   Gait velocity: Decreased   General Gait Details: Pt able to take a few steps with both RW and again with crutches; difficulty holding RLE off ground due to pain, but still managing without assist. Supervision for safety  Stairs Stairs: (Declined; discussed correct technique, will plan for next session)          Wheelchair Mobility    Modified Rankin (Stroke Patients Only)       Balance Overall balance assessment: Needs assistance   Sitting balance-Leahy Scale: Good       Standing balance-Leahy Scale: Poor Standing balance comment: Reliant on UE support to maintain balance and RLE NWB                             Pertinent Vitals/Pain Pain Assessment: Faces Faces Pain Scale: Hurts even more Pain Location: RLE Pain Descriptors / Indicators: Grimacing;Guarding Pain Intervention(s): Monitored during session;Repositioned    Home Living Family/patient expects to be discharged to:: Private residence Living Arrangements: Spouse/significant other;Children Available Help at Discharge: Family Type of Home: House Home Access: Stairs to enter Entrance Stairs-Rails: None Secretary/administratorntrance Stairs-Number of Steps: 3 Home Layout: Two level;1/2 bath on main level Home Equipment: Crutches Additional Comments: Wife works as Runner, broadcasting/film/videoteacher, so has been home and able to assist.Two young children at home    Prior Function Level of Independence: Independent  Comments: Pt works as Engineer, structural, independent with mobility. Initial injury happened 7/3, pt at home with wife assisting, staying on main level, sleeping on couch and wife sponge bathing in half bath     Hand Dominance        Extremity/Trunk Assessment   Upper Extremity Assessment Upper Extremity Assessment:  Overall WFL for tasks assessed    Lower Extremity Assessment Lower Extremity Assessment: RLE deficits/detail RLE Deficits / Details: s/p R tibial ORIF; able to tolerate knee flexion to ~50' RLE: Unable to fully assess due to pain;Unable to fully assess due to immobilization    Cervical / Trunk Assessment Cervical / Trunk Assessment: Normal  Communication   Communication: No difficulties  Cognition Arousal/Alertness: Awake/alert Behavior During Therapy: WFL for tasks assessed/performed Overall Cognitive Status: Within Functional Limits for tasks assessed                                        General Comments General comments (skin integrity, edema, etc.): Discussed brace wear, lock/unlock function, ROM restrictions/timeline, elevation, RLE positioning to encourage knee extension/hip IR/ADD, potential for OP ortho PT once WB restrictions lifted    Exercises Other Exercises Other Exercises: AAROM/PROM of R knee flexion/ext   Assessment/Plan    PT Assessment Patient needs continued PT services  PT Problem List Decreased strength;Decreased range of motion;Decreased activity tolerance;Decreased balance;Decreased mobility;Decreased knowledge of use of DME;Decreased knowledge of precautions;Pain       PT Treatment Interventions DME instruction;Gait training;Stair training;Functional mobility training;Therapeutic activities;Therapeutic exercise;Balance training;Patient/family education    PT Goals (Current goals can be found in the Care Plan section)  Acute Rehab PT Goals Patient Stated Goal: Get back to work PT Goal Formulation: With patient Time For Goal Achievement: 08/06/18 Potential to Achieve Goals: Good    Frequency Min 5X/week   Barriers to discharge        Co-evaluation               AM-PAC PT "6 Clicks" Mobility  Outcome Measure Help needed turning from your back to your side while in a flat bed without using bedrails?: A Little Help needed  moving from lying on your back to sitting on the side of a flat bed without using bedrails?: A Little Help needed moving to and from a bed to a chair (including a wheelchair)?: A Little Help needed standing up from a chair using your arms (e.g., wheelchair or bedside chair)?: A Little Help needed to walk in hospital room?: A Little Help needed climbing 3-5 steps with a railing? : A Little 6 Click Score: 18    End of Session   Activity Tolerance: Patient tolerated treatment well Patient left: in chair;with call bell/phone within reach Nurse Communication: Mobility status PT Visit Diagnosis: Other abnormalities of gait and mobility (R26.89);Pain Pain - Right/Left: Right Pain - part of body: Leg;Knee    Time: 3154-0086 PT Time Calculation (min) (ACUTE ONLY): 29 min   Charges:   PT Evaluation $PT Eval Low Complexity: 1 Low PT Treatments $Gait Training: 8-22 mins   Mabeline Caras, PT, DPT Acute Rehabilitation Services  Pager [phone] Office [place] 07/23/2018, 9:08 AM

## 2018-07-23 NOTE — Plan of Care (Signed)
  Problem: Clinical Measurements: Goal: Ability to maintain clinical measurements within normal limits will improve 07/23/2018 1127 by Williams Che, RN Outcome: Progressing 07/23/2018 1102 by Williams Che, RN Outcome: Progressing   Problem: Activity: Goal: Risk for activity intolerance will decrease 07/23/2018 1127 by Williams Che, RN Outcome: Progressing 07/23/2018 1102 by Williams Che, RN Outcome: Progressing   Problem: Coping: Goal: Level of anxiety will decrease 07/23/2018 1127 by Williams Che, RN Outcome: Progressing 07/23/2018 1102 by Williams Che, RN Outcome: Progressing   Problem: Pain Managment: Goal: General experience of comfort will improve 07/23/2018 1127 by Williams Che, RN Outcome: Progressing 07/23/2018 1102 by Williams Che, RN Outcome: Progressing   Problem: Safety: Goal: Ability to remain free from injury will improve 07/23/2018 1127 by Williams Che, RN Outcome: Progressing 07/23/2018 1102 by Williams Che, RN Outcome: Progressing   Problem: Skin Integrity: Goal: Risk for impaired skin integrity will decrease 07/23/2018 1127 by Williams Che, RN Outcome: Progressing 07/23/2018 1102 by Williams Che, RN Outcome: Progressing

## 2018-07-23 NOTE — TOC Initial Note (Signed)
Transition of Care [facility]) - Initial/Assessment Note    Patient Details  Name: Edwin Craig MRN: [id number] Date of Birth: 21-Feb-1980  Transition of [facility]) CM/SW Contact:    Ninfa Meeker, RN Phone Number: [phone] ( working remotely) 07/23/2018, 3:11 PM  Clinical Narrative: 38 yr old gentleman admitted with Right tibial plateau fracture. Patient underwent a ORIF of right tibial plateau fracture. He is under worker's comp. Case manager spoke with Raynelle Highland with [facility] [phone] ,who provided patient's claim #[id number], asked to contact [employer] to arrange for DME. Referral# for [employer]: [id number], JJ#[id number]. Case manager faxed orders to [employer] [phone]. They will assign a DME agency and have equipemt delivered to patient.                     Expected Discharge Plan: Home/Self Care Barriers to Discharge: No Barriers Identified   Patient Goals and CMS Choice Patient states their goals for this hospitalization and ongoing recovery are:: to recover   Choice offered to / list presented to : NA(patient under workers comp)  Expected Discharge Plan and Services Expected Discharge Plan: Home/Self Care In-house Referral: NA Discharge Planning Services: CM Consult Post Acute Care Choice: Durable Medical Equipment Living arrangements for the past 2 months: Single Family Home                 DME Arranged: Walker rolling, Tub bench DME Agency: (orders faxed to [employer]) Date DME Agency Contacted: 07/23/18 Time DME Agency Contacted: 31   HH Arranged: NA          Prior Living Arrangements/Services Living arrangements for the past 2 months: Single Family Home Lives with:: Self Patient language and need for interpreter reviewed:: No Do you feel safe going back to the place where you live?: Yes          Current home services: DME    Activities of Daily Living Home Assistive Devices/Equipment: Blood pressure cuff, Crutches ADL Screening (condition at time  of admission) Patient's cognitive ability adequate to safely complete daily activities?: Yes Is the patient deaf or have difficulty hearing?: No Does the patient have difficulty seeing, even when wearing glasses/contacts?: No Does the patient have difficulty concentrating, remembering, or making decisions?: No Patient able to express need for assistance with ADLs?: Yes Does the patient have difficulty dressing or bathing?: No Independently performs ADLs?: Yes (appropriate for developmental age) Does the patient have difficulty walking or climbing stairs?: Yes Weakness of Legs: Right Weakness of Arms/Hands: None  Permission Sought/Granted Permission sought to share information with : Case Manager                Emotional Assessment       Orientation: : Oriented to Place, Oriented to  Time, Oriented to Self, Oriented to Situation Alcohol / Substance Use: Not Applicable    Admission diagnosis:  Right tibial plateau fracture Patient Active Problem List   Diagnosis Date Noted  . Tibial plateau fracture, right 07/22/2018  . Closed bicondylar fracture of right tibial plateau 07/20/2018  . OSA (obstructive sleep apnea) 04/02/2016  . Other fatigue 04/02/2016  . Snoring 04/02/2016  . BMI 38.0-38.9,adult 04/02/2016   PCP:  Alonna Buckler, MD Pharmacy:   [id number] - [place], [place] - [address] [place] [address] Phone: [phone] Fax: [phone]     Social Determinants of Health (SDOH) Interventions    Readmission Risk Interventions No flowsheet data found.

## 2018-07-23 NOTE — Progress Notes (Signed)
Orthopaedic Trauma Progress Note  S: Doing okay this morning, pain well controlled. Discussed procedure from yesterday, answered all questions. Patient with no additional concerns right now. Plans to work with therapy today  O:  Vitals:   07/22/18 2119 07/23/18 0321  BP: 127/74 131/64  Pulse: 82 81  Resp: 16 16  Temp: 98.2 F (36.8 C) 98.6 F (37 C)  SpO2: 100% 95%    General - Sitting up in bed, NAD Cardiac - Heart regular rate and rhythm Respiratory - No increased work of breathing. Lungs CTA anterior lung fields bilaterally Right Lower Extremity - Dressing clean, dry, intact. Hinge brace in place. Tenderness with palpation of knee and lower leg. Sensation intact to light touch of extremity. Plantarflexion/dorsiflexion intact. Minimal knee flexion tolerated. Lower leg swollen but compartments compressible.  2+ DP pulse  Imaging: Stable post op imaging.   Labs:  Results for orders placed or performed during the hospital encounter of 07/22/18 (from the past 24 hour(s))  CBC     Status: Abnormal   Collection Time: 07/23/18  2:54 AM  Result Value Ref Range   WBC 12.9 (H) 4.0 - 10.5 K/uL   RBC 5.27 4.22 - 5.81 MIL/uL   Hemoglobin 15.7 13.0 - 17.0 g/dL   HCT 45.4 39.0 - 52.0 %   MCV 86.1 80.0 - 100.0 fL   MCH 29.8 26.0 - 34.0 pg   MCHC 34.6 30.0 - 36.0 g/dL   RDW 12.0 11.5 - 15.5 %   Platelets 264 150 - 400 K/uL   nRBC 0.0 0.0 - 0.2 %  Basic metabolic panel     Status: Abnormal   Collection Time: 07/23/18  2:54 AM  Result Value Ref Range   Sodium 135 135 - 145 mmol/L   Potassium 3.8 3.5 - 5.1 mmol/L   Chloride 103 98 - 111 mmol/L   CO2 23 22 - 32 mmol/L   Glucose, Bld 152 (H) 70 - 99 mg/dL   BUN 16 6 - 20 mg/dL   Creatinine, Ser 1.26 (H) 0.61 - 1.24 mg/dL   Calcium 8.7 (L) 8.9 - 10.3 mg/dL   GFR calc non Af Amer >60 >60 mL/min   GFR calc Af Amer >60 >60 mL/min   Anion gap 9 5 - 15    Assessment: 38 year old male s/p fall from height  Injuries: Right bicondylar  tibial plateau fracture s/p ORIF 07/22/18  Weightbearing: NWB RLE  Insicional and dressing care: Dressing c/d/i. Will change tomorrow  Orthopedic device(s): Hinge knee brace RLE  Okay for knee ROM 0-60 degrees in brace for first 2 weeks  CV/Blood loss: Hgb 15.7 this AM. Hemodynamically stable  Pain management:  1. Tylenol 650 mg q 6 hours scheduled 2. Robaxin 500 mg q 6 hours PRN 3. Oxycodone 5-10 mg q 4 hours PRN 4. Neurontin 100 mg TID 5. Dilaudid 1 mg q 3 hours PRN  VTE prophylaxis: Lovenox starting today  ID:  Ancef 2gm post op completed  Foley/Lines:  No foley, KVO IVFs   Dispo: PT eval today, dispo pending. Hopefully home tomorrow or Friday  Follow - up plan: 2 weeks    Edwin Craig A. Carmie Kanner [facility] ?[phone]? (phone)

## 2018-07-23 NOTE — Plan of Care (Signed)

## 2018-07-24 ENCOUNTER — Observation Stay (HOSPITAL_COMMUNITY): Payer: No Typology Code available for payment source

## 2018-07-24 DIAGNOSIS — S82141A Displaced bicondylar fracture of right tibia, initial encounter for closed fracture: Secondary | ICD-10-CM | POA: Diagnosis not present

## 2018-07-24 LAB — BASIC METABOLIC PANEL
Anion gap: 8 (ref 5–15)
BUN: 15 mg/dL (ref 6–20)
CO2: 24 mmol/L (ref 22–32)
Calcium: 8.2 mg/dL — ABNORMAL LOW (ref 8.9–10.3)
Chloride: 106 mmol/L (ref 98–111)
Creatinine, Ser: 1.13 mg/dL (ref 0.61–1.24)
GFR calc Af Amer: >60 mL/min (ref >60)
GFR calc non Af Amer: >60 mL/min (ref >60)
Glucose, Bld: 107 mg/dL — ABNORMAL HIGH (ref 70–99)
Potassium: 4.1 mmol/L (ref 3.5–5.1)
Sodium: 138 mmol/L (ref 135–145)

## 2018-07-24 MED ORDER — OXYCODONE-ACETAMINOPHEN 5-325 MG PO TABS: 1.0000 | ORAL_TABLET | ORAL | 0 refills | Status: DC | PRN

## 2018-07-24 MED ORDER — METHOCARBAMOL 500 MG PO TABS: 500.0000 mg | ORAL_TABLET | Freq: Four times a day (QID) | ORAL | 0 refills | Status: DC | PRN

## 2018-07-24 MED ORDER — GABAPENTIN 100 MG PO CAPS: 100.0000 mg | ORAL_CAPSULE | Freq: Three times a day (TID) | ORAL | 0 refills | Status: DC

## 2018-07-24 MED ORDER — ASPIRIN EC 325 MG PO TBEC: 325.0000 mg | DELAYED_RELEASE_TABLET | Freq: Every day | ORAL | 0 refills | Status: AC

## 2018-07-24 NOTE — Progress Notes (Signed)
Provided discharge education/instructions, all questions and concerns addressed, Pt not in distress, DME delivered to room, discharged home with belongings accompanied by wife. 

## 2018-07-24 NOTE — Progress Notes (Signed)
Physical Therapy Treatment Patient Details Name: Edwin BoronMatthew L Craig MRN: [id number] DOB: 1980-01-30 Today's Date: 07/24/2018    History of Present Illness Pt is a 38 y.o. 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 Pt sustained R tibial plateau fx, now s/p tibial ORIF 7/8. PMH includes L shoulder fx (2012), PTSD.   PT Comments    Pt progressing well with mobility. Ambulatory with both crutches and RW at supervision-level; able to ascend/descend step with crutches and min guard. Pt reports he plans to use RW at home but crutches on step. Note slight bleeding at R knee incision at end of session (RN notified), therefore deferred R knee PROM. Brace readjusted. Reinforced educ re: positioning, ice/elevation, brace wear, importance of mobility and ROM. Pt concerned about R hip pain, awaiting xray. If to remain admitted, will follow acutely.    Follow Up Recommendations  No PT follow up;Supervision for mobility/OOB(outpatient ortho PT when WB orders increase)     Equipment Recommendations  Rolling walker with 5" wheels    Recommendations for Other Services       Precautions / Restrictions Precautions Precautions: Fall Other Brace: R knee bledsoe brace unlocked with 0-60' ROM permited (for first 2 weeks) Restrictions Weight Bearing Restrictions: Yes RLE Weight Bearing: Non weight bearing    Mobility  Bed Mobility Overal bed mobility: Needs Assistance Bed Mobility: Supine to Sit     Supine to sit: Min assist     General bed mobility comments: MinA to help lower RLE to ground  Transfers Overall transfer level: Needs assistance Equipment used: Rolling walker (2 wheeled);Crutches Transfers: Sit to/from Stand Sit to Stand: Supervision         General transfer comment: Reliant on momentum to power into standing; able to perform sit<>stand with crutches and RW  Ambulation/Gait Ambulation/Gait assistance: Supervision Gait Distance  (Feet): 40 Feet Assistive device: Rolling walker (2 wheeled);Crutches   Gait velocity: Decreased   General Gait Details: Ambulatory with RW and bilateral crutches at supervision-level; good ability to maintain RLE NWB precautions. Pt reports he will likely use RW around house   Stairs Stairs: Yes Stairs assistance: Min guard Stair Management: Forwards;With crutches Number of Stairs: 2 General stair comments: Ascend/descended 2 steps with bilateral crutches; min guard for balance. Pt with good technique and ability to maintain NWB precautions   Wheelchair Mobility    Modified Rankin (Stroke Patients Only)       Balance Overall balance assessment: Needs assistance   Sitting balance-Leahy Scale: Good       Standing balance-Leahy Scale: Fair Standing balance comment: Can static stand on LLE without UE support                            Cognition Arousal/Alertness: Awake/alert Behavior During Therapy: WFL for tasks assessed/performed Overall Cognitive Status: Within Functional Limits for tasks assessed                                        Exercises      General Comments General comments (skin integrity, edema, etc.): Brace readjusted over knee joint; pt with minimal bleeding at stich site which quickly stopped, RN notified, R knee PROM limited by this. Reinforced ice/elevation, positioning, ROM      Pertinent Vitals/Pain Pain Assessment: Faces Faces Pain Scale: Hurts even more Pain  Location: RLE Pain Descriptors / Indicators: Grimacing;Guarding Pain Intervention(s): Monitored during session;Repositioned;Ice applied    Home Living                      Prior Function            PT Goals (current goals can now be found in the care plan section) Acute Rehab PT Goals Patient Stated Goal: Get back to work PT Goal Formulation: With patient Time For Goal Achievement: 08/06/18 Potential to Achieve Goals: Good Progress towards  PT goals: Progressing toward goals    Frequency    Min 5X/week      PT Plan Current plan remains appropriate    Co-evaluation              AM-PAC PT "6 Clicks" Mobility   Outcome Measure  Help needed turning from your back to your side while in a flat bed without using bedrails?: None Help needed moving from lying on your back to sitting on the side of a flat bed without using bedrails?: A Little Help needed moving to and from a bed to a chair (including a wheelchair)?: A Little Help needed standing up from a chair using your arms (e.g., wheelchair or bedside chair)?: A Little Help needed to walk in hospital room?: A Little Help needed climbing 3-5 steps with a railing? : A Little 6 Click Score: 19    End of Session   Activity Tolerance: Patient tolerated treatment well Patient left: in chair;with call bell/phone within reach;with nursing/sitter in room Nurse Communication: Mobility status PT Visit Diagnosis: Other abnormalities of gait and mobility (R26.89);Pain Pain - Right/Left: Right Pain - part of body: Leg;Knee     Time: 0822-0851 PT Time Calculation (min) (ACUTE ONLY): 29 min  Charges:  $Gait Training: 23-37 mins                    Mabeline Caras, PT, DPT Acute Rehabilitation Services  Pager [phone] Office [place] 07/24/2018, 9:01 AM

## 2018-07-24 NOTE — Progress Notes (Addendum)
Occupational Therapy Treatment Patient Details Name: Edwin Craig MRN: [id number] DOB: 01-Sep-1980 Today's Date: 07/24/2018    History of present illness Pt is a 38 y.o. 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 Pt sustained R tibial plateau fx, now s/p tibial ORIF 7/8. PMH includes L shoulder fx (2012), PTSD.   OT comments  Pt participated in session with focus on tub transfer and LB dressing education.  Pt declined practicing tub transfer with bench due to fatigue. Therapist demonstrated and discussed transfer and pt verbalized understanding.  Pt dressed prior to OT session and states he only needed a little bit of help (PT who was with him during dressing confirmed).  Pt returned to bed at end of session due to arrival of transport to take patient to xray.  Pt able to complete functional transfer using crutches with supervision. Will continue to follow acutely.  Follow Up Recommendations  No OT follow up;Supervision - Intermittent    Equipment Recommendations  Tub/shower seat    Recommendations for Other Services      Precautions / Restrictions Precautions Precautions: Fall Required Braces or Orthoses: Other Brace Other Brace: R knee bledsoe brace unlocked with 0-60' ROM permited (for first 2 weeks) Restrictions Weight Bearing Restrictions: Yes RLE Weight Bearing: Non weight bearing       Mobility Bed Mobility Overal bed mobility: Needs Assistance Bed Mobility: Sit to Supine      Sit to supine: Min assist   General bed mobility comments: Assist to support right LE with transition back into bed.   Transfers Overall transfer level: Needs assistance Equipment used: Crutches Transfers: Sit to/from Stand Sit to Stand: Supervision         General transfer comment: supervision for safety    Balance Overall balance assessment: Needs assistance   Sitting balance-Leahy Scale: Good       Standing balance-Leahy  Scale: Fair Standing balance comment: Can static stand on LLE without UE support                           ADL either performed or assessed with clinical judgement   ADL Overall ADL's : Needs assistance/impaired                         Toilet Transfer: Supervision/safety;Ambulation(crutches) Toilet Transfer Details (indicate cue type and reason): simulated from recliner to bed           General ADL Comments: Therapist declining ambulating to therapy gym to practice tub transfer with bench. Therapist brought bench to room and educate pt on use. Also recommended cutting two slits in shower liner in order to keep water in tub.  Pt already dressed in shorts and t shirt. Therapist discussed LB dressing technique (threading right LE through clothing first) and pt verbalized understanding, stating that is strategy he has been using.  Transport arrived during session to take pt to xray.  Pt able to return to bed using crutches, supervision level.      Vision       Perception     Praxis      Cognition Arousal/Alertness: Awake/alert Behavior During Therapy: WFL for tasks assessed/performed Overall Cognitive Status: Within Functional Limits for tasks assessed  Exercises     Shoulder Instructions       General Comments     Pertinent Vitals/ Pain       Pain Assessment: Faces Faces Pain Scale: Hurts even more Pain Location: RLE Pain Descriptors / Indicators: Grimacing;Guarding Pain Intervention(s): Limited activity within patient's tolerance;Repositioned;Monitored during session  Home Living                                          Prior Functioning/Environment              Frequency  Min 2X/week        Progress Toward Goals  OT Goals(current goals can now be found in the care plan section)  Progress towards OT goals: Progressing toward goals  Acute Rehab OT  Goals Patient Stated Goal: Get back to work OT Goal Formulation: With patient Time For Goal Achievement: 07/30/18 Potential to Achieve Goals: Good ADL Goals Pt Will Transfer to Toilet: with supervision;ambulating;regular height toilet Pt Will Perform Tub/Shower Transfer: with supervision;Shower transfer;shower seat;rolling walker Additional ADL Goal #1: Pt will be able to independently demosntrate use of AE for LB ADLs.  Plan Discharge plan remains appropriate    Co-evaluation                 AM-PAC OT "6 Clicks" Daily Activity     Outcome Measure   Help from another person eating meals?: None Help from another person taking care of personal grooming?: A Little(standing) Help from another person toileting, which includes using toliet, bedpan, or urinal?: None Help from another person bathing (including washing, rinsing, drying)?: A Little Help from another person to put on and taking off regular upper body clothing?: None Help from another person to put on and taking off regular lower body clothing?: A Little 6 Click Score: 21    End of Session Equipment Utilized During Treatment: (crutches, bledsoe brace)  OT Visit Diagnosis: Unsteadiness on feet (R26.81);Pain Pain - Right/Left: Right Pain - part of body: Leg   Activity Tolerance Patient limited by fatigue   Patient Left in bed(with transport)   Nurse Communication          Time: 6269-4854: 0938-0952 OT Time Calculation (min): 14 min  Charges: OT General Charges $OT Visit: 1 Visit OT Treatments $Self Care/Home Management : 8-22 mins     Cipriano MileJohnson, Jenna Elizabeth OTR/L Acute Rehabilitation Services [phone] 07/24/2018, 10:04 AM

## 2018-07-24 NOTE — Plan of Care (Signed)
  Problem: Clinical Measurements: Goal: Ability to maintain clinical measurements within normal limits will improve Outcome: Progressing   

## 2018-07-24 NOTE — Plan of Care (Signed)
  Problem: Clinical Measurements: Goal: Ability to maintain clinical measurements within normal limits will improve 07/24/2018 0829 by Madaline Brilliant, RN Outcome: Progressing 07/24/2018 0829 by Madaline Brilliant, RN Outcome: Progressing

## 2019-02-25 ENCOUNTER — Ambulatory Visit: Payer: 59 | Admitting: Family Medicine

## 2019-04-20 ENCOUNTER — Ambulatory Visit: Payer: 59 | Admitting: Family Medicine

## 2019-10-26 ENCOUNTER — Emergency Department (HOSPITAL_COMMUNITY)
Admission: EM | Admit: 2019-10-26 | Discharge: 2019-10-26 | Disposition: A | Discharge status: Home / Self Care | Payer: 59 | Attending: Emergency Medicine | Admitting: Emergency Medicine

## 2019-10-26 ENCOUNTER — Encounter (HOSPITAL_COMMUNITY): Payer: Self-pay | Admitting: Emergency Medicine

## 2019-10-26 ENCOUNTER — Other Ambulatory Visit: Payer: Self-pay

## 2019-10-26 DIAGNOSIS — L02413 Cutaneous abscess of right upper limb: Secondary | ICD-10-CM | POA: Insufficient documentation

## 2019-10-26 DIAGNOSIS — M25511 Pain in right shoulder: Secondary | ICD-10-CM | POA: Insufficient documentation

## 2019-10-26 DIAGNOSIS — L03119 Cellulitis of unspecified part of limb: Secondary | ICD-10-CM

## 2019-10-26 DIAGNOSIS — L02419 Cutaneous abscess of limb, unspecified: Secondary | ICD-10-CM

## 2019-10-26 DIAGNOSIS — M7989 Other specified soft tissue disorders: Secondary | ICD-10-CM | POA: Diagnosis present

## 2019-10-26 DIAGNOSIS — F17210 Nicotine dependence, cigarettes, uncomplicated: Secondary | ICD-10-CM | POA: Diagnosis not present

## 2019-10-26 LAB — CBC WITH DIFFERENTIAL/PLATELET
Abs Immature Granulocytes: 0.11 10*3/uL — ABNORMAL HIGH (ref 0.00–0.07)
Basophils Absolute: 0.1 10*3/uL (ref 0.0–0.1)
Basophils Relative: 1 %
Eosinophils Absolute: 0.1 10*3/uL (ref 0.0–0.5)
Eosinophils Relative: 1 %
HCT: 50 % (ref 39.0–52.0)
Hemoglobin: 16.6 g/dL (ref 13.0–17.0)
Immature Granulocytes: 1 %
Lymphocytes Relative: 26 %
Lymphs Abs: 2.2 10*3/uL (ref 0.7–4.0)
MCH: 29.2 pg (ref 26.0–34.0)
MCHC: 33.2 g/dL (ref 30.0–36.0)
MCV: 88 fL (ref 80.0–100.0)
Monocytes Absolute: 0.7 10*3/uL (ref 0.1–1.0)
Monocytes Relative: 8 %
Neutro Abs: 5.4 10*3/uL (ref 1.7–7.7)
Neutrophils Relative %: 63 %
Platelets: 261 10*3/uL (ref 150–400)
RBC: 5.68 MIL/uL (ref 4.22–5.81)
RDW: 12.2 % (ref 11.5–15.5)
WBC: 8.6 10*3/uL (ref 4.0–10.5)
nRBC: 0 % (ref 0.0–0.2)

## 2019-10-26 LAB — LACTIC ACID, PLASMA
Lactic Acid, Venous: 1.6 mmol/L (ref 0.5–1.9)
Lactic Acid, Venous: 1.4 mmol/L (ref 0.5–1.9)

## 2019-10-26 LAB — COMPREHENSIVE METABOLIC PANEL
ALT: 35 U/L (ref 0–44)
AST: 27 U/L (ref 15–41)
Albumin: 4 g/dL (ref 3.5–5.0)
Alkaline Phosphatase: 66 U/L (ref 38–126)
Anion gap: 11 (ref 5–15)
BUN: 19 mg/dL (ref 6–20)
CO2: 23 mmol/L (ref 22–32)
Calcium: 9.3 mg/dL (ref 8.9–10.3)
Chloride: 105 mmol/L (ref 98–111)
Creatinine, Ser: 1.48 mg/dL — ABNORMAL HIGH (ref 0.61–1.24)
GFR, Estimated: 58 mL/min — ABNORMAL LOW (ref >60)
Glucose, Bld: 128 mg/dL — ABNORMAL HIGH (ref 70–99)
Potassium: 4.6 mmol/L (ref 3.5–5.1)
Sodium: 139 mmol/L (ref 135–145)
Total Bilirubin: 0.8 mg/dL (ref 0.3–1.2)
Total Protein: 7.2 g/dL (ref 6.5–8.1)

## 2019-10-26 MED ORDER — CLINDAMYCIN HCL 150 MG PO CAPS: 300.0000 mg | ORAL_CAPSULE | Freq: Four times a day (QID) | ORAL | 0 refills | Status: DC

## 2019-10-26 MED ORDER — HYDROCODONE-ACETAMINOPHEN 5-325 MG PO TABS: 1.0000 | ORAL_TABLET | Freq: Four times a day (QID) | ORAL | 0 refills | Status: DC | PRN

## 2019-10-26 MED ORDER — LIDOCAINE-EPINEPHRINE 1 %-1:100000 IJ SOLN
10.0000 mL | Freq: Once | INTRAMUSCULAR | Status: AC
  Administered 2019-10-26: 10 mL
  Filled 2019-10-26: qty 1

## 2019-10-26 MED ORDER — CLINDAMYCIN PHOSPHATE 600 MG/50ML IV SOLN
600.0000 mg | Freq: Once | INTRAVENOUS | Status: AC
  Administered 2019-10-26: 600 mg via INTRAVENOUS
  Filled 2019-10-26: qty 50

## 2019-10-26 NOTE — ED Provider Notes (Signed)
[facility] EMERGENCY DEPARTMENT Provider Note   CSN: [id number] Arrival date & time: 10/26/19  1734     History Chief Complaint  Patient presents with  . Insect Bite    Edwin Craig is a 39 y.o. male.  Patient presents the emergency department for evaluation of right arm swelling.  Patient developed an area of swelling of the right forearm that progressed.  Patient went to [facility] where he was started on Bactrim.  They did not perform incision and drainage at that time.  Patient states that the area of redness has spread up the arm a bit prompting emergency department evaluation.  Patient denies history of diabetes or immunocompromise.  Denies injury to the area.  No fevers.  He has some achiness in his right shoulder.  No nausea or vomiting.  The area is open and draining.        Past Medical History:  Diagnosis Date  . GERD (gastroesophageal reflux disease)   . Neuromuscular disorder (HCC)    left shoulder ? "like it asleep." had a nerve test , tolod it wass fine." started after shoulder surgery  . PTSD (post-traumatic stress disorder)     Patient Active Problem List   Diagnosis Date Noted  . Tibial plateau fracture, right 07/22/2018  . Closed bicondylar fracture of right tibial plateau 07/20/2018  . OSA (obstructive sleep apnea) 04/02/2016  . Other fatigue 04/02/2016  . Snoring 04/02/2016  . BMI 38.0-38.9,adult 04/02/2016    Past Surgical History:  Procedure Laterality Date  . FRACTURE SURGERY Left 2012   Left shoulder  . ORIF TIBIA FRACTURE Right 07/22/2018  . ORIF TIBIA PLATEAU Right 07/22/2018   Procedure: OPEN REDUCTION INTERNAL FIXATION (ORIF) TIBIAL PLATEAU;  Surgeon: Roby Lofts, MD;  Location: [facility] OR;  Service: Orthopedics;  Laterality: Right;       Family History  Problem Relation Age of Onset  . Healthy Mother   . Healthy Father   . Healthy Sister   . Healthy Brother   . Healthy Sister     Social  History   Tobacco Use  . Smoking status: Current Every Day Smoker    Packs/day: 0.12    Types: Cigarettes  . Smokeless tobacco: Never Used  Vaping Use  . Vaping Use: Never used  Substance Use Topics  . Alcohol use: Not Currently  . Drug use: No    Home Medications Prior to Admission medications   Medication Sig Start Date End Date Taking? Authorizing Provider  gabapentin (NEURONTIN) 100 MG capsule Take 1 capsule (100 mg total) by mouth 3 (three) times daily. 07/24/18   Despina Hidden, PA-C  methocarbamol (ROBAXIN) 500 MG tablet Take 1 tablet (500 mg total) by mouth every 6 (six) hours as needed for muscle spasms. 07/24/18   Despina Hidden, PA-C  oxyCODONE-acetaminophen (PERCOCET) 5-325 MG tablet Take 1 tablet by mouth every 4 (four) hours as needed for severe pain. 07/24/18   Despina Hidden, PA-C  pantoprazole (PROTONIX) 40 MG tablet Take 40 mg by mouth daily.  03/25/16   [provider]    Allergies    Patient has no known allergies.  Review of Systems   Review of Systems  Constitutional: Negative for fever.  Gastrointestinal: Negative for nausea and vomiting.  Musculoskeletal: Positive for myalgias.  Skin: Positive for color change and wound.       Positive for abscess  Hematological: Negative for adenopathy.    Physical Exam Updated Vital  Signs BP 121/77   Pulse 74   Temp 98.8 F (37.1 C) (Oral)   Resp 17   Ht 6' (1.829 m)   Wt 136.1 kg   SpO2 96%   BMI 40.69 kg/m   Physical Exam Vitals and nursing note reviewed.  Constitutional:      Appearance: He is well-developed.     Comments: Patient appears well, nontoxic.  HENT:     Head: Normocephalic and atraumatic.  Eyes:     Conjunctiva/sclera: Conjunctivae normal.  Pulmonary:     Effort: No respiratory distress.  Musculoskeletal:     Cervical back: Normal range of motion and neck supple.  Skin:    General: Skin is warm and dry.     Comments: Patient with an approximately 4 cm area of induration  to the right proximal volar forearm.  There is a small opening at the distal end of the area of induration draining a small amount of purulent fluid.  Patient has associated surrounding warmth and erythema consistent with cellulitis.  This has spread beyond a marker line proximally.  No lymphangitis.  Neurological:     Mental Status: He is alert.     ED Results / Procedures / Treatments   Labs (all labs ordered are listed, but only abnormal results are displayed) Labs Reviewed  COMPREHENSIVE METABOLIC PANEL - Abnormal; Notable for the following components:      Result Value   Glucose, Bld 128 (*)    Creatinine, Ser 1.48 (*)    GFR, Estimated 58 (*)    All other components within normal limits  CBC WITH DIFFERENTIAL/PLATELET - Abnormal; Notable for the following components:   Abs Immature Granulocytes 0.11 (*)    All other components within normal limits  LACTIC ACID, PLASMA  LACTIC ACID, PLASMA    EKG None  Radiology No results found.  Procedures .Marland KitchenIncision and Drainage  Date/Time: 10/26/2019 10:33 PM Performed by: Renne Crigler, PA-C Authorized by: Renne Crigler, PA-C   Consent:    Consent obtained:  Verbal   Consent given by:  Patient   Risks discussed:  Bleeding, incomplete drainage, pain, infection and damage to other organs   Alternatives discussed:  No treatment and observation Location:    Type:  Abscess   Size:  4cm   Location:  Upper extremity   Upper extremity location:  Arm   Arm location:  R lower arm Pre-procedure details:    Skin preparation:  Betadine Anesthesia (see MAR for exact dosages):    Anesthesia method:  Local infiltration   Local anesthetic:  Lidocaine 2% WITH epi Procedure type:    Complexity:  Simple Procedure details:    Incision types:  Stab incision   Scalpel blade:  11   Wound management:  Probed and deloculated   Drainage:  Purulent   Drainage amount:  Scant   Wound treatment:  Drain placed   Packing materials:  1/4 in  iodoform gauze Post-procedure details:    Patient tolerance of procedure:  Tolerated well, no immediate complications   (including critical care time)  Medications Ordered in ED Medications  clindamycin (CLEOCIN) IVPB 600 mg (has no administration in time range)  lidocaine-EPINEPHrine (XYLOCAINE W/EPI) 1 %-1:100000 (with pres) injection 10 mL (10 mLs Infiltration Given 10/26/19 2154)    ED Course  I have reviewed the triage vital signs and the nursing notes.  Pertinent labs & imaging results that were available during my care of the patient were reviewed by me and considered in my  medical decision making (see chart for details).  Patient seen and examined.  Labs are reassuring with normal lactate and white count.  Discussed incision and drainage procedure with patient and he agrees to proceed.  Discussed utility and limitation of IV antibiotics without drainage.  Will give a dose of clindamycin and switch from Bactrim to clindamycin.  Patient states that he is scheduled for a follow-up appointment in 3 days.  Vital signs reviewed and are as follows: BP 138/77   Pulse 68   Temp 98.8 F (37.1 C) (Oral)   Resp 16   Ht 6' (1.829 m)   Wt 136.1 kg   SpO2 96%   BMI 40.69 kg/m   10:34 PM I&D performed without complication.  IV antibiotics ordered.  Plan discharge to home with clindamycin, pain medication after discharge with close PCP follow-up.  Pt urged to return with worsening pain, worsening swelling, expanding area of redness or streaking up extremity, fever, or any other concerns. Urged to take complete course of antibiotics as prescribed. Counseled to take pain medications as prescribed. Pt verbalizes understanding and agrees with plan.     MDM Rules/Calculators/A&P                          Patient with forearm abscess and cellulitis.  He has been on oral antibiotics for couple of days.  I&D was not performed at initial visit.  Patient appears well, nontoxic.  No fevers or  tachycardia.  Normal lactate and normal white blood cell count.  He does not have history of diabetes or other immunocompromising conditions.  Do not feel that he requires hospitalization at this time but will require close outpatient follow-up.  Antibiotics changed from Bactrim to clindamycin.   Final Clinical Impression(s) / ED Diagnoses Final diagnoses:  Cellulitis and abscess of upper extremity    Rx / DC Orders ED Discharge Orders         Ordered    clindamycin (CLEOCIN) 150 MG capsule  Every 6 hours        10/26/19 2235           Renne Crigler, PA-C 10/26/19 2238    Gwyneth Sprout, MD 10/27/19 1350

## 2019-10-26 NOTE — ED Triage Notes (Signed)
Pt. Stated, I had a little bump to begin with and its just gotten worse. Went to Dr. 4 days agoand given antibiotic and was told if it got worse to come here.  Red swollen on upper forearm.  Antibiotic is Sulamethoxaz DS  Onw every 12 hours.

## 2019-10-26 NOTE — Discharge Instructions (Signed)
Please read and follow all provided instructions.  Your diagnoses today include:  1. Cellulitis and abscess of upper extremity     Tests performed today include:  Vital signs. See below for your results today.   Medications prescribed:   Clindamycin - antibiotic  You have been prescribed an antibiotic medicine: take the entire course of medicine even if you are feeling better. Stopping early can cause the antibiotic not to work.   Vicodin (hydrocodone/acetaminophen) - narcotic pain medication  DO NOT drive or perform any activities that require you to be awake and alert because this medicine can make you drowsy. BE VERY CAREFUL not to take multiple medicines containing Tylenol (also called acetaminophen). Doing so can lead to an overdose which can damage your liver and cause liver failure and possibly death.  Take any prescribed medications only as directed.   Home care instructions:   Follow any educational materials contained in this packet  Follow-up instructions: Return to your doctor in 48 hours for a recheck if your symptoms are not significantly improved.  Return instructions:  Return to the Emergency Department if you have:  Fever  Worsening symptoms  Worsening pain  Worsening swelling  Redness of the skin that moves away from the affected area, especially if it streaks away from the affected area   Any other emergent concerns  Your vital signs today were: BP 138/77   Pulse 68   Temp 98.8 F (37.1 C) (Oral)   Resp 16   Ht 6' (1.829 m)   Wt 136.1 kg   SpO2 96%   BMI 40.69 kg/m  If your blood pressure (BP) was elevated above 135/85 this visit, please have this repeated by your doctor within one month. --------------

## 2020-05-15 ENCOUNTER — Telehealth: Payer: Self-pay | Admitting: Neurology

## 2020-05-15 NOTE — Telephone Encounter (Signed)
Pt accepted appointment on 06/19/20 and added to cancellation list.

## 2020-05-15 NOTE — Telephone Encounter (Signed)
Pt called, my puppy chewed a hole in my CPAP hose. I called the company, they told me I needed to call to schedule an appt for a follow up for my CPAP machine. I need mask, straps, hose and filter.  Would like a call from the nurse.

## 2020-05-15 NOTE — Telephone Encounter (Signed)
Edwin Craig, can you call and offer 06/19/20 at 2:30pm with Dr. Epimenio Foot?That is the soonest available that we have

## 2020-05-22 NOTE — Telephone Encounter (Signed)
Called pt. Offered sooner appt 05/31/20 at 11:30am. Pt accepted. I cx June and August appt since he is coming in sooner.

## 2020-05-30 ENCOUNTER — Encounter: Payer: Self-pay | Admitting: Neurology

## 2020-05-31 ENCOUNTER — Ambulatory Visit: Payer: 59 | Admitting: Neurology

## 2020-05-31 ENCOUNTER — Encounter: Payer: Self-pay | Admitting: Neurology

## 2020-05-31 VITALS — BP 119/71 | HR 74 | Ht 72.0 in | Wt 298.0 lb

## 2020-05-31 DIAGNOSIS — Z6841 Body Mass Index (BMI) 40.0 and over, adult: Secondary | ICD-10-CM | POA: Diagnosis not present

## 2020-05-31 DIAGNOSIS — G4733 Obstructive sleep apnea (adult) (pediatric): Secondary | ICD-10-CM

## 2020-05-31 NOTE — Progress Notes (Signed)
Faxed signed orders back to [facility] at [phone]. Received fax confirmation.

## 2020-05-31 NOTE — Progress Notes (Signed)
[facility]  PATIENT: Edwin Craig DOB: [birth year]  REFERRING DOCTOR OR PCP:  Dr. Gregary Signs SOURCE: patient, notes from Dr. Tresa Endo  _________________________________   HISTORICAL  CHIEF COMPLAINT:   Chief Complaint  Patient presents with  . Follow-up    RM 12, alone. Last seen 02/19/2018. Needs CPAP supplies. Dog chewed via hose and needs new one.     HISTORY OF PRESENT ILLNESS:   Update 05/31/2020: He had been using CPAP nightly.  Unfortunately his new puppy chewed through the hose and he needs supplies.  He has his hose taped up now but has slight leak.  He has the heated hose.    He only uses a new mask about every 9-12 months.   He has used [employer]   He uses a FF Mask.   The strap caused an infection once.     He uses an alcohol swab for the mask to clean.  Download showed 100% compliance and excelent efficacy with an AHI = 0.9  He gained a few pounds last year when he broke his legs and was less active for several months.     He had a sleep study split-night study 05/14/2016 showing an AHI = 49.    He was titrated to CPAP + and has tolerated it better going down from +9 to +7 cm H2O pressure.   I filled out the [place] form for sleep apnea.  He reports feeling less congested and the humidifier helps.    Once a week he wakes up and has trouble falling back asleep.     EPWORTH SLEEPINESS SCALE  On a scale of 0 - 3 what is the chance of dozing:  Sitting and Reading:   0  Watching TV:    0 Sitting inactive in a public place: 0 Passenger in car for one hour: 0 Lying down to rest in the afternoon: 1 Sitting and talking to someone: 0 Sitting quietly after lunch:  1 In a car, stopped in traffic:  0  Total (out of 24):    2/24    No excessive sleepiness       REVIEW OF SYSTEMS: Constitutional: No fevers, chills, sweats, or change in appetite. Mild fatigue improved by CPAP Eyes: No visual changes, double vision, eye pain Ear, nose and  throat: No hearing loss, ear pain, nasal congestion, sore throat Cardiovascular: No chest pain, palpitations Respiratory: No shortness of breath at rest or with exertion.   No wheezes.   He has OSA on CPAP GastrointestinaI: he has GERD.  No nausea, vomiting, diarrhea, abdominal pain, fecal incontinence Genitourinary: No dysuria, urinary retention or frequency.  No nocturia. Musculoskeletal: No neck pain, back pain Integumentary: No rash, pruritus, skin lesions Neurological: as above Psychiatric: No depression at this time.  No anxiety Endocrine: No palpitations, diaphoresis, change in appetite, change in weigh or increased thirst Hematologic/Lymphatic: No anemia, purpura, petechiae. Allergic/Immunologic: No itchy/runny eyes, nasal congestion, recent allergic reactions, rashes  ALLERGIES: No Known Allergies  HOME MEDICATIONS:  Current Outpatient Medications:  .  pantoprazole (PROTONIX) 40 MG tablet, Take 40 mg by mouth daily. , Disp: , Rfl:   PAST MEDICAL HISTORY: Past Medical History:  Diagnosis Date  . GERD (gastroesophageal reflux disease)   . Neuromuscular disorder (HCC)    left shoulder ? "like it asleep." had a nerve test , tolod it wass fine." started after shoulder surgery  . PTSD (post-traumatic stress disorder)     PAST SURGICAL HISTORY: Past Surgical  History:  Procedure Laterality Date  . FRACTURE SURGERY Left 2012   Left shoulder  . ORIF TIBIA FRACTURE Right 07/22/2018  . ORIF TIBIA PLATEAU Right 07/22/2018   Procedure: OPEN REDUCTION INTERNAL FIXATION (ORIF) TIBIAL PLATEAU;  Surgeon: Roby Lofts, MD;  Location: [facility] OR;  Service: Orthopedics;  Laterality: Right;    FAMILY HISTORY: Family History  Problem Relation Age of Onset  . Healthy Mother   . Healthy Father   . Healthy Sister   . Healthy Brother   . Healthy Sister     SOCIAL HISTORY:  Social History   Socioeconomic History  . Marital status: Single    Spouse name: Not on file  . Number of  children: Not on file  . Years of education: Not on file  . Highest education level: Not on file  Occupational History  . Not on file  Tobacco Use  . Smoking status: Current Every Day Smoker    Packs/day: 0.12    Types: Cigarettes  . Smokeless tobacco: Never Used  Vaping Use  . Vaping Use: Never used  Substance and Sexual Activity  . Alcohol use: Not Currently  . Drug use: No  . Sexual activity: Yes  Other Topics Concern  . Not on file  Social History Narrative  . Not on file   Social Determinants of Health   Financial Resource Strain: Not on file  Food Insecurity: Not on file  Transportation Needs: Not on file  Physical Activity: Not on file  Stress: Not on file  Social Connections: Not on file  Intimate Partner Violence: Not on file     PHYSICAL EXAM  Vitals:   05/31/20 1015  BP: 119/71  Pulse: 74  SpO2: 97%  Weight: 298 lb (135.2 kg)  Height: 6' (1.829 m)    Body mass index is 40.42 kg/m.   General: The patient is well-developed and well-nourished and in no acute distress.   Head is [place]/AT.   Pharynx is nonerythematous.  He is Mallampati2.       Neurologic Exam  Mental status: The patient is alert and oriented x 3 at the time of the examination. The patient has apparent normal recent and remote memory, with an apparently normal attention span and concentration ability.   Speech is normal.  Cranial nerves: Extraocular movements are full.   Facial strength is normal. No obvious hearing deficits are noted.  Motor: Muscle bulk is normal.  Strength is normal.   Gait and station: Station is normal.   Gait is normal.  Tandem gait is normal.      DIAGNOSTIC DATA (LABS, IMAGING, TESTING) - I reviewed patient records, labs, notes, testing and imaging myself where available.     ASSESSMENT AND PLAN  OSA (obstructive sleep apnea)  Adult BMI 40.0-44.9 kg/sq m (HCC)    1.  Continue CPAP +7 cm H2O. 2.   We will fax in a form to [employer] for new  supplies. 3.    Return in one year or call sooner if any problems. Millianna Szymborski A. Epimenio Foot, MD, PhD 05/31/2020, 8:41 PM Certified in Neurology, Clinical Neurophysiology, Sleep Medicine, Pain Medicine and Neuroimaging  [facility] [address] [place], [place] [address] [phone]

## 2020-06-19 ENCOUNTER — Ambulatory Visit: Payer: 59 | Admitting: Neurology

## 2020-09-03 ENCOUNTER — Emergency Department (HOSPITAL_BASED_OUTPATIENT_CLINIC_OR_DEPARTMENT_OTHER)
Admission: EM | Admit: 2020-09-03 | Discharge: 2020-09-04 | Disposition: A | Discharge status: Home / Self Care | Payer: No Typology Code available for payment source | Attending: Emergency Medicine | Admitting: Emergency Medicine

## 2020-09-03 ENCOUNTER — Emergency Department (HOSPITAL_BASED_OUTPATIENT_CLINIC_OR_DEPARTMENT_OTHER): Payer: No Typology Code available for payment source

## 2020-09-03 ENCOUNTER — Encounter (HOSPITAL_BASED_OUTPATIENT_CLINIC_OR_DEPARTMENT_OTHER): Payer: Self-pay | Admitting: *Deleted

## 2020-09-03 ENCOUNTER — Other Ambulatory Visit: Payer: Self-pay

## 2020-09-03 DIAGNOSIS — S8992XA Unspecified injury of left lower leg, initial encounter: Secondary | ICD-10-CM | POA: Diagnosis present

## 2020-09-03 DIAGNOSIS — X501XXA Overexertion from prolonged static or awkward postures, initial encounter: Secondary | ICD-10-CM | POA: Diagnosis not present

## 2020-09-03 DIAGNOSIS — Y990 Civilian activity done for income or pay: Secondary | ICD-10-CM | POA: Insufficient documentation

## 2020-09-03 DIAGNOSIS — F17210 Nicotine dependence, cigarettes, uncomplicated: Secondary | ICD-10-CM | POA: Diagnosis not present

## 2020-09-03 DIAGNOSIS — S8392XA Sprain of unspecified site of left knee, initial encounter: Secondary | ICD-10-CM | POA: Diagnosis not present

## 2020-09-03 DIAGNOSIS — Y9302 Activity, running: Secondary | ICD-10-CM | POA: Diagnosis not present
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
## 2020-09-04 NOTE — Discharge Instructions (Addendum)
You were seen today for knee injury.  Ice and elevate.  If you note increased swelling or have persistent pain, you may need to follow-up with sports medicine or orthopedics for advanced imaging.

## 2020-09-06 ENCOUNTER — Ambulatory Visit: Payer: 59 | Admitting: Neurology

## 2021-01-04 IMAGING — DX PORTABLE RIGHT KNEE - 1-2 VIEW
2 series · 2 of 2 positions shown · non-contrast
Comparison: Radiographs and CT scan dated 07/17/2018

CLINICAL DATA: Open reduction and internal fixation of right tibia
fracture.

EXAM:
PORTABLE RIGHT KNEE - 1-2 VIEW

[knee ap]
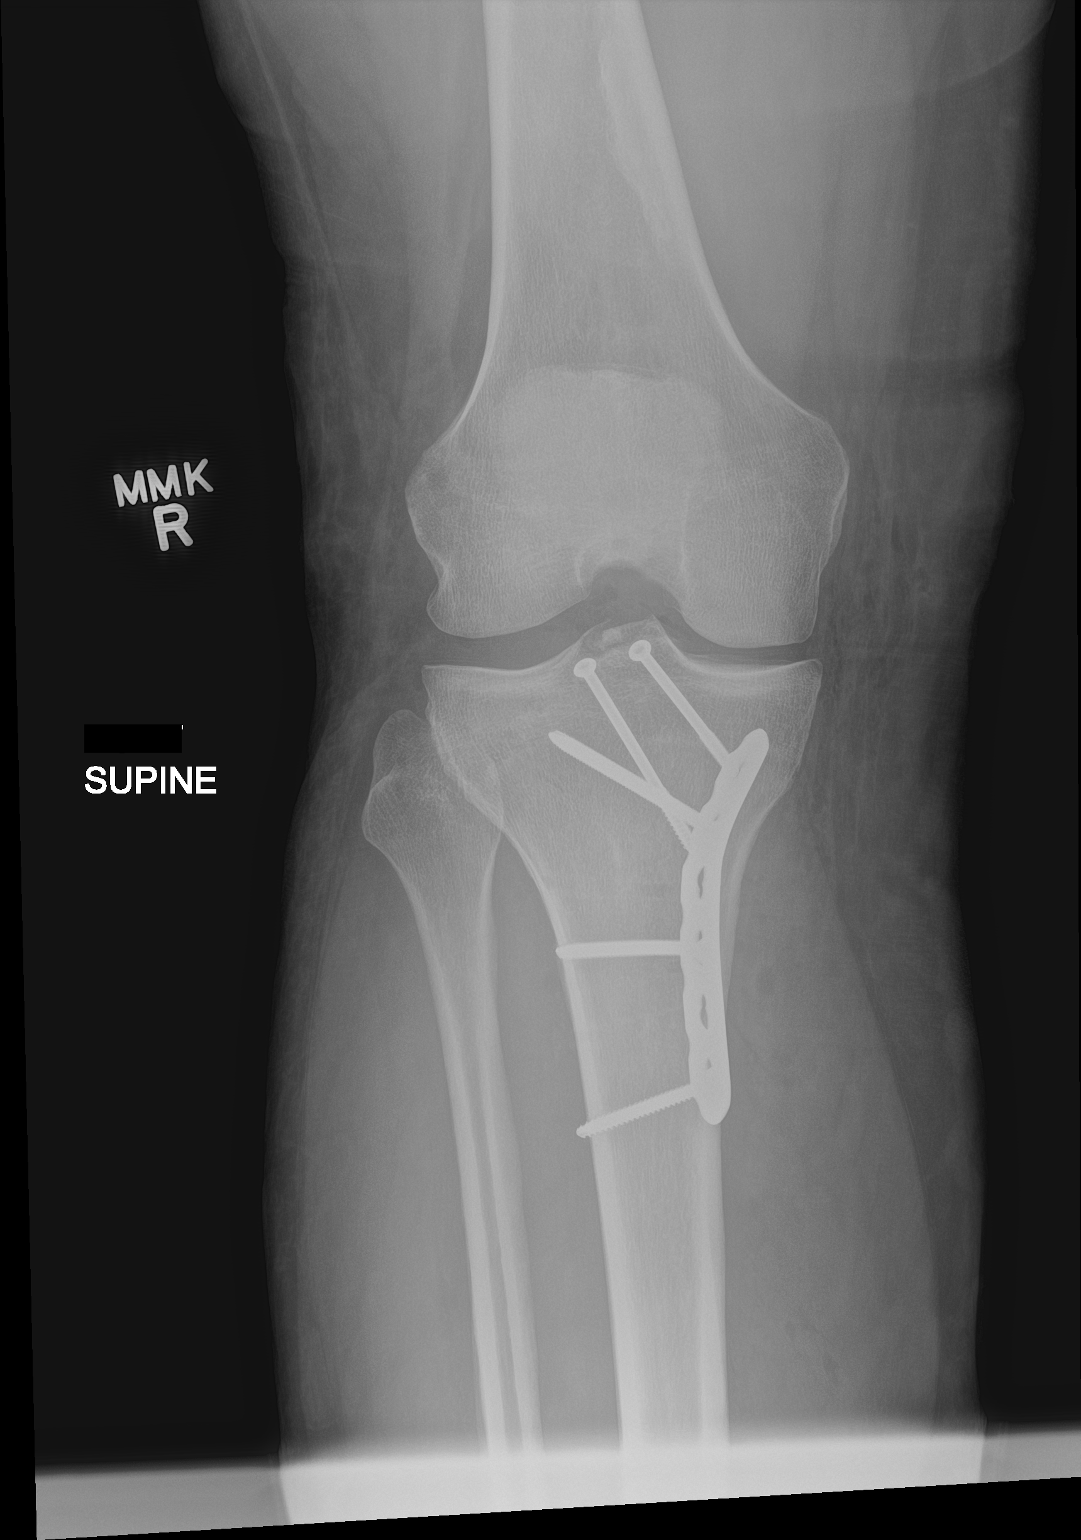

[knee lat]
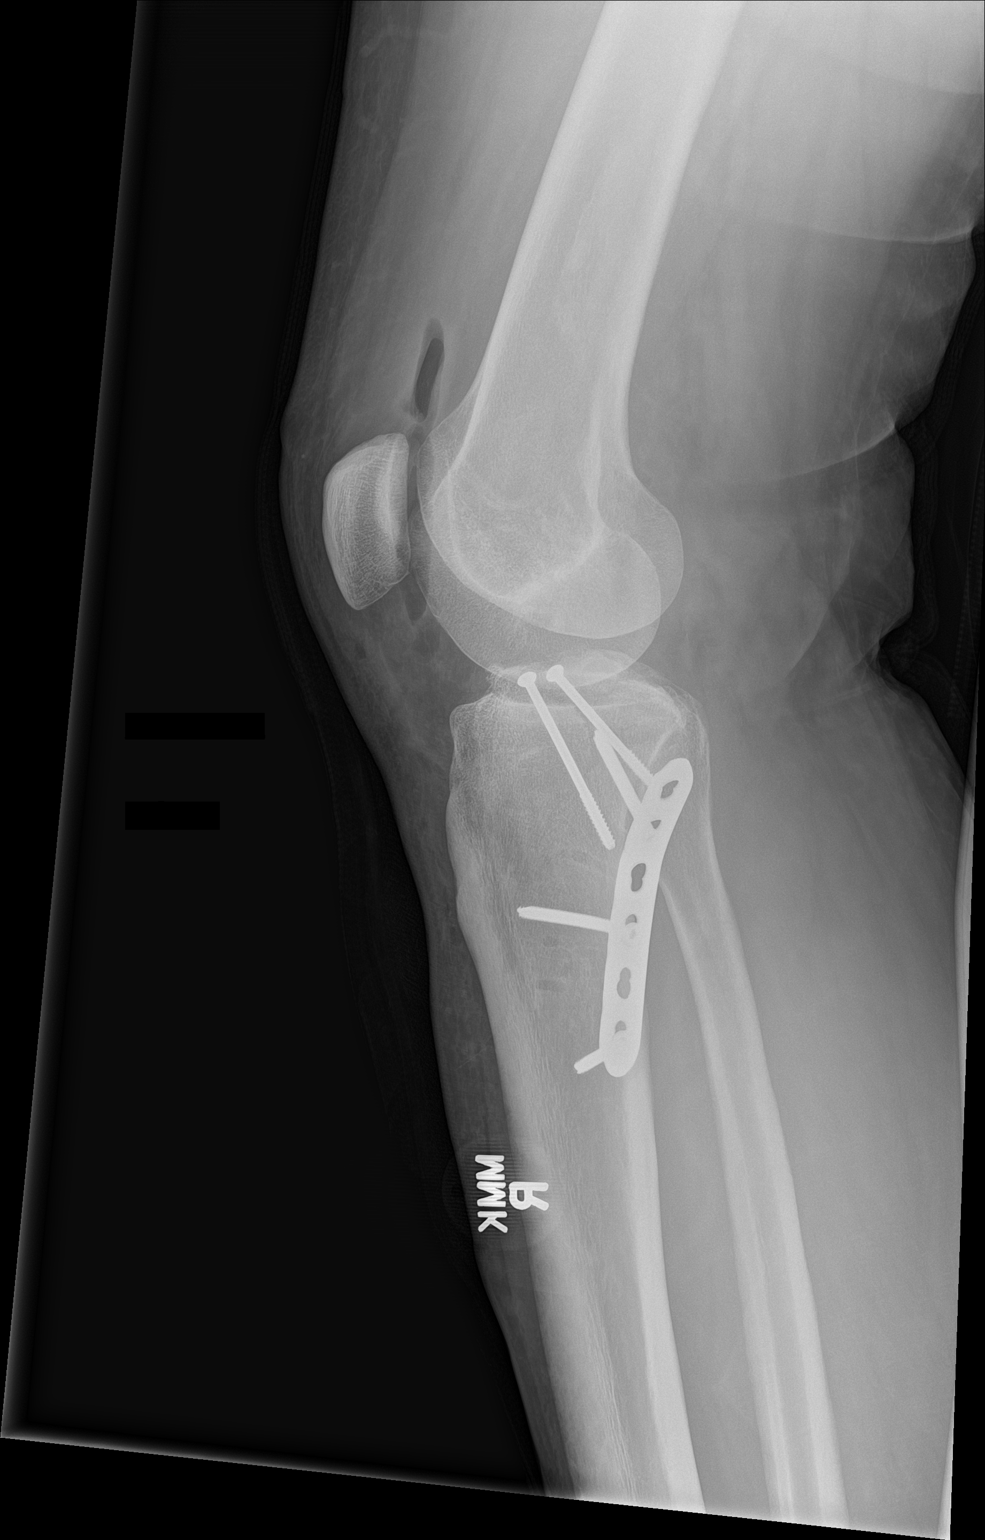

[2 of 2 positions shown; findings below may reference images not displayed]

FINDINGS: Patient has undergone open reduction internal fixation of the
comminuted fracture of the proximal tibia. Alignment and position of
the fracture fragments appears essentially anatomic. Side plate and
multiple screws in place.
IMPRESSION: Satisfactory appearance of the proximal right tibia after open
reduction and internal fixation.

## 2021-05-31 ENCOUNTER — Ambulatory Visit: Payer: 59 | Admitting: Family Medicine

## 2021-06-08 ENCOUNTER — Telehealth: Payer: Self-pay | Admitting: Internal Medicine

## 2021-06-08 NOTE — Telephone Encounter (Signed)
Good Afternoon Dr. Leonides Schanz,  D.O.D for 5/17 AM  We have received a referral for patient to be seen for GERD and dysphasia. We have records from his last EGD procedure with [facility] in 2020. I will be sending records for you to review. Will you please review and advise on scheduling?  Thank you.

## 2021-06-13 NOTE — Telephone Encounter (Signed)
Left voicemail for patient to call back to schedule

## 2021-06-14 ENCOUNTER — Encounter: Payer: Self-pay | Admitting: Internal Medicine

## 2021-06-22 ENCOUNTER — Encounter: Payer: Self-pay | Admitting: Internal Medicine

## 2021-06-22 ENCOUNTER — Ambulatory Visit: Payer: 59 | Admitting: Internal Medicine

## 2021-06-22 VITALS — BP 144/90 | HR 72 | Ht 71.26 in | Wt 306.0 lb

## 2021-06-22 DIAGNOSIS — K219 Gastro-esophageal reflux disease without esophagitis: Secondary | ICD-10-CM | POA: Diagnosis not present

## 2021-06-22 DIAGNOSIS — R0989 Other specified symptoms and signs involving the circulatory and respiratory systems: Secondary | ICD-10-CM

## 2021-06-22 DIAGNOSIS — R1310 Dysphagia, unspecified: Secondary | ICD-10-CM

## 2021-06-22 NOTE — Progress Notes (Addendum)
Chief Complaint: GERD, dysphagia  HPI : 41 year old male with history of obesity, GERD, PTSD presents with GERD and dysphagia  He has had dysphagia for the last 4-5 months. Dysphagia only occurs at night time when he is swallowing saliva. The saliva doesn't seem like it is going down. Sometimes feels like there is something sitting in his throat. Has had a sore throat as well. He has had GERD for years for which he takes Protonix 40 mg QD. Has very rare breakthrough reflux. Denies N&V, abdominal pain, melena, hematochezia, unintentional weight loss, diarrhea, constipation. Denies fam hx of GI issues. Last EGD was several years ago.  Past Medical History:  Diagnosis Date   GERD (gastroesophageal reflux disease)    HLD (hyperlipidemia)    Neuromuscular disorder (HCC)    left shoulder ? "like it asleep." had a nerve test , tolod it wass fine." started after shoulder surgery   Obesity    PTSD (post-traumatic stress disorder)    Sleep apnea treated with continuous positive airway pressure (CPAP)    Past Surgical History:  Procedure Laterality Date   FRACTURE SURGERY Left 2012   Left shoulder   ORIF TIBIA FRACTURE Right 07/22/2018   ORIF TIBIA PLATEAU Right 07/22/2018   Procedure: OPEN REDUCTION INTERNAL FIXATION (ORIF) TIBIAL PLATEAU;  Surgeon: Roby Lofts, MD;  Location: [facility] OR;  Service: Orthopedics;  Laterality: Right;   Family History  Problem Relation Age of Onset   Healthy Mother    Healthy Father    Healthy Sister    Healthy Sister    Healthy Brother    Diabetes Paternal Grandmother    Social History   Tobacco Use   Smoking status: Every Day    Packs/day: 0.12    Types: Cigarettes   Smokeless tobacco: Never  Vaping Use   Vaping Use: Never used  Substance Use Topics   Alcohol use: Yes    Comment: very occasional   Drug use: No   Current Outpatient Medications  Medication Sig Dispense Refill   fenofibrate (TRICOR) 145 MG tablet Take 145 mg by mouth daily.      pantoprazole (PROTONIX) 40 MG tablet Take 40 mg by mouth daily.      No current facility-administered medications for this visit.   No Known Allergies  Review of Systems: All systems reviewed and negative except where noted in HPI.   Physical Exam: BP (!) 144/90 (BP Location: Left Arm, Patient Position: Sitting, Cuff Size: Normal)   Pulse 72   Ht 5' 11.26" (1.81 m) Comment: height measured without shoes  Wt (!) 306 lb (138.8 kg)   BMI 42.37 kg/m  Constitutional: Pleasant,well-developed, male in no acute distress. HEENT: Normocephalic and atraumatic. Conjunctivae are normal. No scleral icterus. Cardiovascular: Normal rate, regular rhythm.  Pulmonary/chest: Effort normal and breath sounds normal. No wheezing, rales or rhonchi. Abdominal: Soft, nondistended, nontender. Bowel sounds active throughout. There are no masses palpable. No hepatomegaly. Extremities: No edema Neurological: Alert and oriented to person place and time. Skin: Skin is warm and dry. No rashes noted. Psychiatric: Normal mood and affect. Behavior is normal.  Labs 10/2019: CBC nml. CMP with elevated Cr of 1.48 and elevated glucose of 128.  EGD 03/12/18: Erythema in the duodenal bulb and second part of the duodenum, biopsied Erythema in the antrum compatible with gastritis, biopsied. Mucosa suggestive of Barrett's esophagus, biopsied. Path: Duodenal biopsy - Duodenal mucosa with mild intraepithelial lymphocytosis and preserved villous architecture. Nonspecific and raise a differential including SIBO,  medication-induced injury by NSAIDs, olmesartan, and other sartans), peptic injury, celiac disease, and sensitivity to non-gluten protein, among others Gastric Bx - Trace chronic inactive gastritis. Negative for H pylori and GIM Esophageal Bx - Squamocolumnar mucosa with features of reflux esophagitis. Associated reactive changes are present. Negative for intestinal metaplasia or dysplasia.  ASSESSMENT AND  PLAN: GERD Globus Dysphagia Sore throat Patient presents with longstanding acid reflux and more recently has had trouble with swallowing saliva.  He has also noticed some globus sensation and sore throat.  His symptoms could still be due to uncontrolled acid reflux.  I will plan for further evaluation with EGD to rule out peptic stricture or malignancy. Will also give the patient a hand out on conservative ways to reduce his issues with GERD. - GERD handout - Continue PPI QD - EGD [facility]  [name], MD

## 2021-06-22 NOTE — Patient Instructions (Signed)
If you are age 43 or older, your body mass index should be between 23-30. Your Body mass index is 42.37 kg/m. If this is out of the aforementioned range listed, please consider follow up with your Primary Care Provider.  If you are age 27 or younger, your body mass index should be between 19-25. Your Body mass index is 42.37 kg/m. If this is out of the aformentioned range listed, please consider follow up with your Primary Care Provider.   You have been scheduled for an endoscopy. Please follow written instructions given to you at your visit today. If you use inhalers (even only as needed), please bring them with you on the day of your procedure.  Please follow GERD handout provided.   The [facility] providers would like to encourage you to use [facility] to communicate with providers for non-urgent requests or questions.  Due to long hold times on the telephone, sending your provider a message by [facility] may be a faster and more efficient way to get a response.  Please allow 48 business hours for a response.  Please remember that this is for non-urgent requests.   Thank you for entrusting me with your care and for choosing [facility], Dr. Christia Reading

## 2021-08-07 ENCOUNTER — Ambulatory Visit (AMBULATORY_SURGERY_CENTER): Payer: 59 | Admitting: Internal Medicine

## 2021-08-07 ENCOUNTER — Encounter: Payer: Self-pay | Admitting: Internal Medicine

## 2021-08-07 VITALS — BP 137/91 | HR 69 | Temp 97.5°F | Resp 19 | Ht 71.0 in | Wt 306.0 lb

## 2021-08-07 DIAGNOSIS — R1310 Dysphagia, unspecified: Secondary | ICD-10-CM

## 2021-08-07 DIAGNOSIS — K219 Gastro-esophageal reflux disease without esophagitis: Secondary | ICD-10-CM

## 2021-08-07 DIAGNOSIS — K317 Polyp of stomach and duodenum: Secondary | ICD-10-CM

## 2021-08-07 DIAGNOSIS — K2960 Other gastritis without bleeding: Secondary | ICD-10-CM

## 2021-08-07 DIAGNOSIS — R12 Heartburn: Secondary | ICD-10-CM | POA: Diagnosis not present

## 2021-08-07 DIAGNOSIS — K259 Gastric ulcer, unspecified as acute or chronic, without hemorrhage or perforation: Secondary | ICD-10-CM | POA: Diagnosis not present

## 2021-08-07 DIAGNOSIS — K2970 Gastritis, unspecified, without bleeding: Secondary | ICD-10-CM

## 2021-08-07 DIAGNOSIS — K2980 Duodenitis without bleeding: Secondary | ICD-10-CM

## 2021-08-07 MED ORDER — PANTOPRAZOLE SODIUM 40 MG PO TBEC: 40.0000 mg | DELAYED_RELEASE_TABLET | Freq: Two times a day (BID) | ORAL | 6 refills | Status: AC

## 2021-08-07 MED ORDER — SODIUM CHLORIDE 0.9 % IV SOLN: 500.0000 mL | Freq: Once | INTRAVENOUS | Status: DC

## 2021-08-07 NOTE — Op Note (Signed)
[facility] Patient Name: Edwin Craig Procedure Date: 08/07/2021 10:34 AM MRN: [id number] Endoscopist: Nicole Kindred "Edwin Craig ,  Age: 41 Referring MD:  Date of Birth: Apr 28, 1980 Gender: Male Account #: [id number] Procedure:                Upper GI endoscopy Indications:              Dysphagia, Heartburn Medicines:                Monitored Anesthesia Care Procedure:                Pre-Anesthesia Assessment:                           - Prior to the procedure, a History and Physical                            was performed, and patient medications and                            allergies were reviewed. The patient's tolerance of                            previous anesthesia was also reviewed. The risks                            and benefits of the procedure and the sedation                            options and risks were discussed with the patient.                            All questions were answered, and informed consent                            was obtained. Prior Anticoagulants: The patient has                            taken no previous anticoagulant or antiplatelet                            agents. ASA Grade Assessment: III - A patient with                            severe systemic disease. After reviewing the risks                            and benefits, the patient was deemed in                            satisfactory condition to undergo the procedure.                           After obtaining informed consent, the endoscope was  passed under direct vision. Throughout the                            procedure, the patient's blood pressure, pulse, and                            oxygen saturations were monitored continuously. The                            GIF [id number] #[id number] was introduced through the                            mouth, and advanced to the second part of duodenum.                            The upper GI endoscopy was  accomplished without                            difficulty. The patient tolerated the procedure                            well. Scope In: Scope Out: Findings:                 Localized mucosal variance characterized by an                            esophageal inlet patch was found in the proximal                            esophagus.                           Biopsies were taken with a cold forceps in the                            proximal esophagus and in the distal esophagus for                            histology.                           A single 2 mm sessile polyp with no bleeding and no                            stigmata of recent bleeding was found in the                            gastric body. The polyp was removed with a cold                            biopsy forceps. Resection and retrieval were                            complete.  Localized inflammation characterized by congestion                            (edema) and erythema was found in the gastric                            antrum. Biopsies were taken with a cold forceps for                            histology.                           Localized mild inflammation characterized by                            congestion (edema) and erythema was found in the                            duodenal bulb. Biopsies were taken with a cold                            forceps for histology. Complications:            No immediate complications. Estimated Blood Loss:     Estimated blood loss was minimal. Impression:               - Esophageal inlet patch.                           - A single gastric polyp. Resected and retrieved.                           - Gastritis. Biopsied.                           - Duodenitis. Biopsied.                           - Biopsies were taken with a cold forceps for                            histology in the proximal esophagus and in the                             distal esophagus. Recommendation:           - Discharge patient to home (with escort).                           - Await pathology results.                           - Use Protonix (pantoprazole) 40 mg PO BID for 8                            weeks.                           -  The findings and recommendations were discussed                            with the patient.                           - Return to GI clinic in 6 weeks. Nicole Kindred "Edwin Craig,  08/07/2021 10:57:18 AM

## 2021-08-07 NOTE — Progress Notes (Signed)
To pacu, VSS. Report to [email or web address].tb 

## 2021-08-07 NOTE — Addendum Note (Signed)
Addended by: Caren Griffins F on: 08/07/2021 11:28 AM   Modules accepted: Orders

## 2021-08-07 NOTE — Progress Notes (Signed)
GASTROENTEROLOGY PROCEDURE H&P NOTE   Primary Care Physician: Pcp, No    Reason for Procedure:   Dysphagia, GERD, globus  Plan:    EGD  Patient is appropriate for endoscopic procedure(s) in the ambulatory ([facility]) setting.  The nature of the procedure, as well as the risks, benefits, and alternatives were carefully and thoroughly reviewed with the patient. Ample time for discussion and questions allowed. The patient understood, was satisfied, and agreed to proceed.     HPI: Edwin Craig is a 41 y.o. male who presents for EGD for evaluation of dysphagia, GERD, and globus .  Patient was most recently seen in the Gastroenterology Clinic on 06/22/21.  No interval change in medical history since that appointment. Please refer to that note for full details regarding GI history and clinical presentation.   Past Medical History:  Diagnosis Date   GERD (gastroesophageal reflux disease)    HLD (hyperlipidemia)    Neuromuscular disorder (HCC)    left shoulder ? "like it asleep." had a nerve test , tolod it wass fine." started after shoulder surgery   Obesity    PTSD (post-traumatic stress disorder)    Sleep apnea treated with continuous positive airway pressure (CPAP)     Past Surgical History:  Procedure Laterality Date   FRACTURE SURGERY Left 2012   Left shoulder   ORIF TIBIA FRACTURE Right 07/22/2018   ORIF TIBIA PLATEAU Right 07/22/2018   Procedure: OPEN REDUCTION INTERNAL FIXATION (ORIF) TIBIAL PLATEAU;  Surgeon: Roby Lofts, MD;  Location: [facility] OR;  Service: Orthopedics;  Laterality: Right;    Prior to Admission medications   Medication Sig Start Date End Date Taking? Authorizing Provider  fenofibrate (TRICOR) 145 MG tablet Take 145 mg by mouth daily. 06/10/21  Yes [provider]  pantoprazole (PROTONIX) 40 MG tablet Take 40 mg by mouth daily.  03/25/16  Yes [provider]    Current Outpatient Medications  Medication Sig Dispense Refill   fenofibrate  (TRICOR) 145 MG tablet Take 145 mg by mouth daily.     pantoprazole (PROTONIX) 40 MG tablet Take 40 mg by mouth daily.      Current Facility-Administered Medications  Medication Dose Route Frequency Provider Last Rate Last Admin   0.9 %  sodium chloride infusion  500 mL Intravenous Once Imogene Burn, MD        Allergies as of 08/07/2021   (No Known Allergies)    Family History  Problem Relation Age of Onset   Healthy Mother    Healthy Father    Healthy Sister    Healthy Sister    Healthy Brother    Esophageal cancer Maternal Grandfather    Diabetes Paternal Grandmother    Colon polyps Neg Hx    Stomach cancer Neg Hx    Rectal cancer Neg Hx     Social History   Socioeconomic History   Marital status: Single    Spouse name: Not on file   Number of children: 2   Years of education: Not on file   Highest education level: Not on file  Occupational History   Occupation: Police  Tobacco Use   Smoking status: Every Day    Packs/day: 0.12    Types: Cigarettes   Smokeless tobacco: Never  Vaping Use   Vaping Use: Never used  Substance and Sexual Activity   Alcohol use: Yes    Comment: very occasional   Drug use: No   Sexual activity: Yes  Other Topics Concern  Not on file  Social History Narrative   Not on file   Social Determinants of Health   Financial Resource Strain: Not on file  Food Insecurity: Not on file  Transportation Needs: Not on file  Physical Activity: Not on file  Stress: Not on file  Social Connections: Not on file  Intimate Partner Violence: Not on file    Physical Exam: Vital signs in last 24 hours: BP 117/66   Pulse 73   Temp (!) 97.5 F (36.4 C)   Ht 5\' 11"  (1.803 m)   Wt (!) 306 lb (138.8 kg)   SpO2 95%   BMI 42.68 kg/m  GEN: NAD EYE: Sclerae anicteric ENT: MMM CV: Non-tachycardic Pulm: No increased WOB GI: Soft NEURO:  Alert & Oriented   , MD [facility]   08/07/2021 10:26 AM

## 2021-08-07 NOTE — Progress Notes (Signed)
Called to room to assist during endoscopic procedure.  Patient ID and intended procedure confirmed with present staff. Received instructions for my participation in the procedure from the performing physician.  

## 2021-08-07 NOTE — Patient Instructions (Signed)
Thank you for coming in to see [place] today. Resume previous diet and medications today. Increase your Protonix ( Pantoprazole) 40 mg to twice per day for 8 weeks, then reduce to daily. Follow up at GI clinic in 6 weeks.  We will call to make this appointment with you. Return to normal daily activities  tomorrow.     YOU HAD AN ENDOSCOPIC PROCEDURE TODAY AT THE [facility]:   Refer to the procedure report that was given to you for any specific questions about what was found during the examination.  If the procedure report does not answer your questions, please call your gastroenterologist to clarify.  If you requested that your care partner not be given the details of your procedure findings, then the procedure report has been included in a sealed envelope for you to review at your convenience later.  YOU SHOULD EXPECT: Some feelings of bloating in the abdomen. Passage of more gas than usual.  Walking can help get rid of the air that was put into your GI tract during the procedure and reduce the bloating. If you had a lower endoscopy (such as a colonoscopy or flexible sigmoidoscopy) you may notice spotting of blood in your stool or on the toilet paper. If you underwent a bowel prep for your procedure, you may not have a normal bowel movement for a few days.  Please Note:  You might notice some irritation and congestion in your nose or some drainage.  This is from the oxygen used during your procedure.  There is no need for concern and it should clear up in a day or so.  SYMPTOMS TO REPORT IMMEDIATELY:  Following upper endoscopy (EGD)  Vomiting of blood or coffee ground material  New chest pain or pain under the shoulder blades  Painful or persistently difficult swallowing  New shortness of breath  Fever of 100F or higher  Black, tarry-looking stools  For urgent or emergent issues, a gastroenterologist can be reached at any hour by calling [phone]. Do not use MyChart  messaging for urgent concerns.    DIET:  We do recommend a small meal at first, but then you may proceed to your regular diet.  Drink plenty of fluids but you should avoid alcoholic beverages for 24 hours.  ACTIVITY:  You should plan to take it easy for the rest of today and you should NOT DRIVE or use heavy machinery until tomorrow (because of the sedation medicines used during the test).    FOLLOW UP: Our staff will call the number listed on your records the next business day following your procedure.  We will call around 7:15- 8:00 am to check on you and address any questions or concerns that you may have regarding the information given to you following your procedure. If we do not reach you, we will leave a message.  If you develop any symptoms (ie: fever, flu-like symptoms, shortness of breath, cough etc.) before then, please call [phone].  If you test positive for Covid 19 in the 2 weeks post procedure, please call and report this information to [place].    If any biopsies were taken you will be contacted by phone or by letter within the next 1-3 weeks.  Please call us at [phone] if you have not heard about the biopsies in 3 weeks.    SIGNATURES/CONFIDENTIALITY: You and/or your care partner have signed paperwork which will be entered into your electronic medical record.  These signatures attest to the  fact that that the information above on your After Visit Summary has been reviewed and is understood.  Full responsibility of the confidentiality of this discharge information lies with you and/or your care-partner.

## 2021-08-08 ENCOUNTER — Telehealth: Payer: Self-pay

## 2021-08-08 NOTE — Telephone Encounter (Signed)
  Follow up Call-     08/07/2021    9:31 AM  Call back number  Post procedure Call Back phone  # [phone]  Permission to leave phone message Yes     Patient questions:  Do you have a fever, pain , or abdominal swelling? No. Pain Score  0 *  Have you tolerated food without any problems? Yes.    Have you been able to return to your normal activities? Yes.    Do you have any questions about your discharge instructions: Diet   No. Medications  No. Follow up visit  No.  Do you have questions or concerns about your Care? No.  Actions: * If pain score is 4 or above: No action needed, pain <4.

## 2021-08-10 ENCOUNTER — Encounter: Payer: Self-pay | Admitting: Internal Medicine

## 2022-01-09 ENCOUNTER — Encounter: Payer: Self-pay | Admitting: Internal Medicine
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
## 2023-08-06 ENCOUNTER — Telehealth: Payer: Self-pay | Admitting: *Deleted

## 2023-08-06 NOTE — Telephone Encounter (Signed)
 Not able to reach pt. Pt voicemail is full.

## 2024-01-06 ENCOUNTER — Telehealth: Payer: Self-pay | Admitting: Neurology

## 2024-01-06 NOTE — Telephone Encounter (Signed)
 Received sleep referral from [facility]  for OSA. Placed in sleep referrals box

## 2024-02-05 ENCOUNTER — Telehealth: Payer: Self-pay | Admitting: Neurology

## 2024-02-05 NOTE — Telephone Encounter (Signed)
 Pt had to r/s due to a conflict with another appointment

## 2024-02-05 NOTE — Telephone Encounter (Signed)
 [facility] conf

## 2024-02-09 ENCOUNTER — Institutional Professional Consult (permissible substitution): Admitting: Neurology

## 2024-06-29 ENCOUNTER — Institutional Professional Consult (permissible substitution): Admitting: Neurology
